# Patient Record
Sex: Female | Born: 1965
Health system: Southern US, Community
[De-identification: ages and names within clinical notes are randomized; demographics above are authoritative.]

## PROBLEM LIST (undated history)

## (undated) DIAGNOSIS — R569 Unspecified convulsions: Secondary | ICD-10-CM

## (undated) DIAGNOSIS — F32A Depression, unspecified: Secondary | ICD-10-CM

## (undated) DIAGNOSIS — F419 Anxiety disorder, unspecified: Secondary | ICD-10-CM

## (undated) DIAGNOSIS — I219 Acute myocardial infarction, unspecified: Secondary | ICD-10-CM

## (undated) DIAGNOSIS — E039 Hypothyroidism, unspecified: Secondary | ICD-10-CM

## (undated) DIAGNOSIS — E785 Hyperlipidemia, unspecified: Secondary | ICD-10-CM

## (undated) DIAGNOSIS — F4310 Post-traumatic stress disorder, unspecified: Secondary | ICD-10-CM

## (undated) DIAGNOSIS — Z8616 Personal history of COVID-19: Secondary | ICD-10-CM

## (undated) DIAGNOSIS — N809 Endometriosis, unspecified: Secondary | ICD-10-CM

## (undated) DIAGNOSIS — F1021 Alcohol dependence, in remission: Secondary | ICD-10-CM

## (undated) DIAGNOSIS — F329 Major depressive disorder, single episode, unspecified: Secondary | ICD-10-CM

## (undated) DIAGNOSIS — G4733 Obstructive sleep apnea (adult) (pediatric): Secondary | ICD-10-CM

## (undated) HISTORY — DX: Hyperlipidemia, unspecified: E78.5

## (undated) HISTORY — DX: Personal history of COVID-19: Z86.16

## (undated) HISTORY — DX: Major depressive disorder, single episode, unspecified: F32.9

## (undated) HISTORY — DX: Depression, unspecified: F32.A

## (undated) HISTORY — DX: Obstructive sleep apnea (adult) (pediatric): G47.33

## (undated) HISTORY — DX: Alcohol dependence, in remission: F10.21

## (undated) HISTORY — DX: Post-traumatic stress disorder, unspecified: F43.10

## (undated) HISTORY — PX: NASAL SINUS SURGERY: SHX719

## (undated) HISTORY — DX: Anxiety disorder, unspecified: F41.9

## (undated) HISTORY — DX: Hypothyroidism, unspecified: E03.9

## (undated) HISTORY — PX: ABDOMINAL SURGERY: SHX537

---

## 2005-09-14 ENCOUNTER — Emergency Department: Payer: Self-pay | Admitting: Emergency Medicine

## 2005-12-16 ENCOUNTER — Emergency Department: Payer: Self-pay

## 2006-09-19 ENCOUNTER — Ambulatory Visit (HOSPITAL_COMMUNITY): Admission: RE | Admit: 2006-09-19 | Discharge: 2006-09-19 | Payer: Self-pay | Admitting: Otolaryngology

## 2006-10-16 ENCOUNTER — Ambulatory Visit (HOSPITAL_BASED_OUTPATIENT_CLINIC_OR_DEPARTMENT_OTHER): Admission: RE | Admit: 2006-10-16 | Discharge: 2006-10-16 | Payer: Self-pay | Admitting: Otolaryngology

## 2006-10-23 ENCOUNTER — Ambulatory Visit (HOSPITAL_COMMUNITY): Admission: RE | Admit: 2006-10-23 | Discharge: 2006-10-23 | Payer: Self-pay | Admitting: Otolaryngology

## 2007-04-24 ENCOUNTER — Emergency Department (HOSPITAL_COMMUNITY): Admission: EM | Admit: 2007-04-24 | Discharge: 2007-04-24 | Payer: Self-pay | Admitting: Emergency Medicine

## 2007-07-09 ENCOUNTER — Ambulatory Visit (HOSPITAL_COMMUNITY): Admission: RE | Admit: 2007-07-09 | Discharge: 2007-07-09 | Payer: Self-pay | Admitting: Interventional Cardiology

## 2007-09-14 LAB — CONVERTED CEMR LAB: Pap Smear: NORMAL

## 2007-10-07 ENCOUNTER — Encounter: Payer: Self-pay | Admitting: Family Medicine

## 2007-12-10 ENCOUNTER — Ambulatory Visit: Payer: Self-pay | Admitting: *Deleted

## 2007-12-10 DIAGNOSIS — F411 Generalized anxiety disorder: Secondary | ICD-10-CM | POA: Insufficient documentation

## 2007-12-10 DIAGNOSIS — F1020 Alcohol dependence, uncomplicated: Secondary | ICD-10-CM | POA: Insufficient documentation

## 2007-12-10 DIAGNOSIS — E785 Hyperlipidemia, unspecified: Secondary | ICD-10-CM | POA: Insufficient documentation

## 2007-12-10 DIAGNOSIS — F319 Bipolar disorder, unspecified: Secondary | ICD-10-CM

## 2007-12-10 DIAGNOSIS — F329 Major depressive disorder, single episode, unspecified: Secondary | ICD-10-CM

## 2008-01-02 ENCOUNTER — Ambulatory Visit: Payer: Self-pay | Admitting: *Deleted

## 2008-01-02 LAB — CONVERTED CEMR LAB
ALT: 21 units/L (ref 0–35)
Alkaline Phosphatase: 62 units/L (ref 39–117)
BUN: 11 mg/dL (ref 6–23)
Basophils Relative: 0.6 % (ref 0.0–3.0)
Cholesterol: 208 mg/dL (ref 0–200)
Creatinine, Ser: 0.9 mg/dL (ref 0.4–1.2)
Eosinophils Relative: 4.8 % (ref 0.0–5.0)
GFR calc non Af Amer: 74 mL/min
Glucose, Bld: 68 mg/dL — ABNORMAL LOW (ref 70–99)
Lymphocytes Relative: 22.2 % (ref 12.0–46.0)
Monocytes Absolute: 0.7 10*3/uL (ref 0.1–1.0)
Monocytes Relative: 8.3 % (ref 3.0–12.0)
Neutro Abs: 5.2 10*3/uL (ref 1.4–7.7)
RBC: 4.16 M/uL (ref 3.87–5.11)
Sodium: 139 meq/L (ref 135–145)
TSH: 1.09 microintl units/mL (ref 0.35–5.50)
Total Bilirubin: 0.6 mg/dL (ref 0.3–1.2)
Total CHOL/HDL Ratio: 4
Total Protein: 7.2 g/dL (ref 6.0–8.3)
Triglycerides: 81 mg/dL (ref 0–149)
VLDL: 16 mg/dL (ref 0–40)

## 2008-01-13 ENCOUNTER — Emergency Department (HOSPITAL_COMMUNITY): Admission: EM | Admit: 2008-01-13 | Discharge: 2008-01-13 | Payer: Self-pay | Admitting: Emergency Medicine

## 2008-03-10 ENCOUNTER — Ambulatory Visit: Payer: Self-pay | Admitting: *Deleted

## 2008-03-10 ENCOUNTER — Ambulatory Visit (HOSPITAL_BASED_OUTPATIENT_CLINIC_OR_DEPARTMENT_OTHER): Admission: RE | Admit: 2008-03-10 | Discharge: 2008-03-10 | Payer: Self-pay | Admitting: *Deleted

## 2008-03-10 ENCOUNTER — Ambulatory Visit: Payer: Self-pay | Admitting: Diagnostic Radiology

## 2008-10-03 ENCOUNTER — Inpatient Hospital Stay (HOSPITAL_COMMUNITY): Admission: EM | Admit: 2008-10-03 | Discharge: 2008-10-06 | Payer: Self-pay | Admitting: Emergency Medicine

## 2008-10-03 LAB — CONVERTED CEMR LAB
CO2: 29 meq/L
Chloride: 102 meq/L
Creatinine, Ser: 0.94 mg/dL
Glucose, Bld: 107 mg/dL
Potassium: 3.7 meq/L
TSH: 2.837 microintl units/mL

## 2008-10-23 ENCOUNTER — Ambulatory Visit: Payer: Self-pay | Admitting: Family Medicine

## 2008-10-23 ENCOUNTER — Encounter: Payer: Self-pay | Admitting: Family Medicine

## 2008-10-23 DIAGNOSIS — E039 Hypothyroidism, unspecified: Secondary | ICD-10-CM | POA: Insufficient documentation

## 2008-10-27 ENCOUNTER — Telehealth: Payer: Self-pay | Admitting: Family Medicine

## 2008-12-01 ENCOUNTER — Telehealth: Payer: Self-pay | Admitting: Family Medicine

## 2009-02-03 ENCOUNTER — Telehealth: Payer: Self-pay | Admitting: Family Medicine

## 2009-02-05 ENCOUNTER — Encounter: Payer: Self-pay | Admitting: Family Medicine

## 2009-02-07 ENCOUNTER — Emergency Department (HOSPITAL_COMMUNITY): Admission: EM | Admit: 2009-02-07 | Discharge: 2009-02-07 | Payer: Self-pay | Admitting: Emergency Medicine

## 2009-02-10 ENCOUNTER — Encounter: Payer: Self-pay | Admitting: Family Medicine

## 2009-02-10 ENCOUNTER — Ambulatory Visit: Payer: Self-pay | Admitting: Family Medicine

## 2009-02-12 ENCOUNTER — Telehealth: Payer: Self-pay | Admitting: *Deleted

## 2009-02-18 ENCOUNTER — Ambulatory Visit: Payer: Self-pay | Admitting: Family Medicine

## 2009-02-18 ENCOUNTER — Encounter: Payer: Self-pay | Admitting: Family Medicine

## 2009-02-25 ENCOUNTER — Telehealth: Payer: Self-pay | Admitting: Family Medicine

## 2009-03-02 ENCOUNTER — Telehealth (INDEPENDENT_AMBULATORY_CARE_PROVIDER_SITE_OTHER): Payer: Self-pay | Admitting: Family Medicine

## 2009-03-03 ENCOUNTER — Ambulatory Visit: Payer: Self-pay | Admitting: Family Medicine

## 2009-03-04 ENCOUNTER — Telehealth: Payer: Self-pay | Admitting: *Deleted

## 2009-03-05 ENCOUNTER — Ambulatory Visit (HOSPITAL_COMMUNITY): Admission: RE | Admit: 2009-03-05 | Discharge: 2009-03-05 | Payer: Self-pay | Admitting: Family Medicine

## 2009-03-24 ENCOUNTER — Encounter: Payer: Self-pay | Admitting: Family Medicine

## 2009-04-28 ENCOUNTER — Ambulatory Visit (HOSPITAL_BASED_OUTPATIENT_CLINIC_OR_DEPARTMENT_OTHER): Admission: RE | Admit: 2009-04-28 | Discharge: 2009-04-29 | Payer: Self-pay | Admitting: Orthopedic Surgery

## 2009-05-13 ENCOUNTER — Encounter: Admission: RE | Admit: 2009-05-13 | Discharge: 2009-08-04 | Payer: Self-pay | Admitting: Orthopedic Surgery

## 2009-05-14 ENCOUNTER — Encounter: Payer: Self-pay | Admitting: Family Medicine
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
## 2009-05-25 ENCOUNTER — Telehealth: Payer: Self-pay | Admitting: Family Medicine

## 2009-05-27 ENCOUNTER — Telehealth: Payer: Self-pay | Admitting: Family Medicine

## 2009-05-28 ENCOUNTER — Telehealth: Payer: Self-pay | Admitting: Family Medicine

## 2009-06-02 ENCOUNTER — Telehealth: Payer: Self-pay | Admitting: Family Medicine

## 2009-06-20 IMAGING — CR DG CHEST 2V
2 series · 2 of 2 positions shown · non-contrast
Comparison: None.

CLINICAL DATA: Cough and fever. Chest pain.

[view not recorded (1 of 2)]
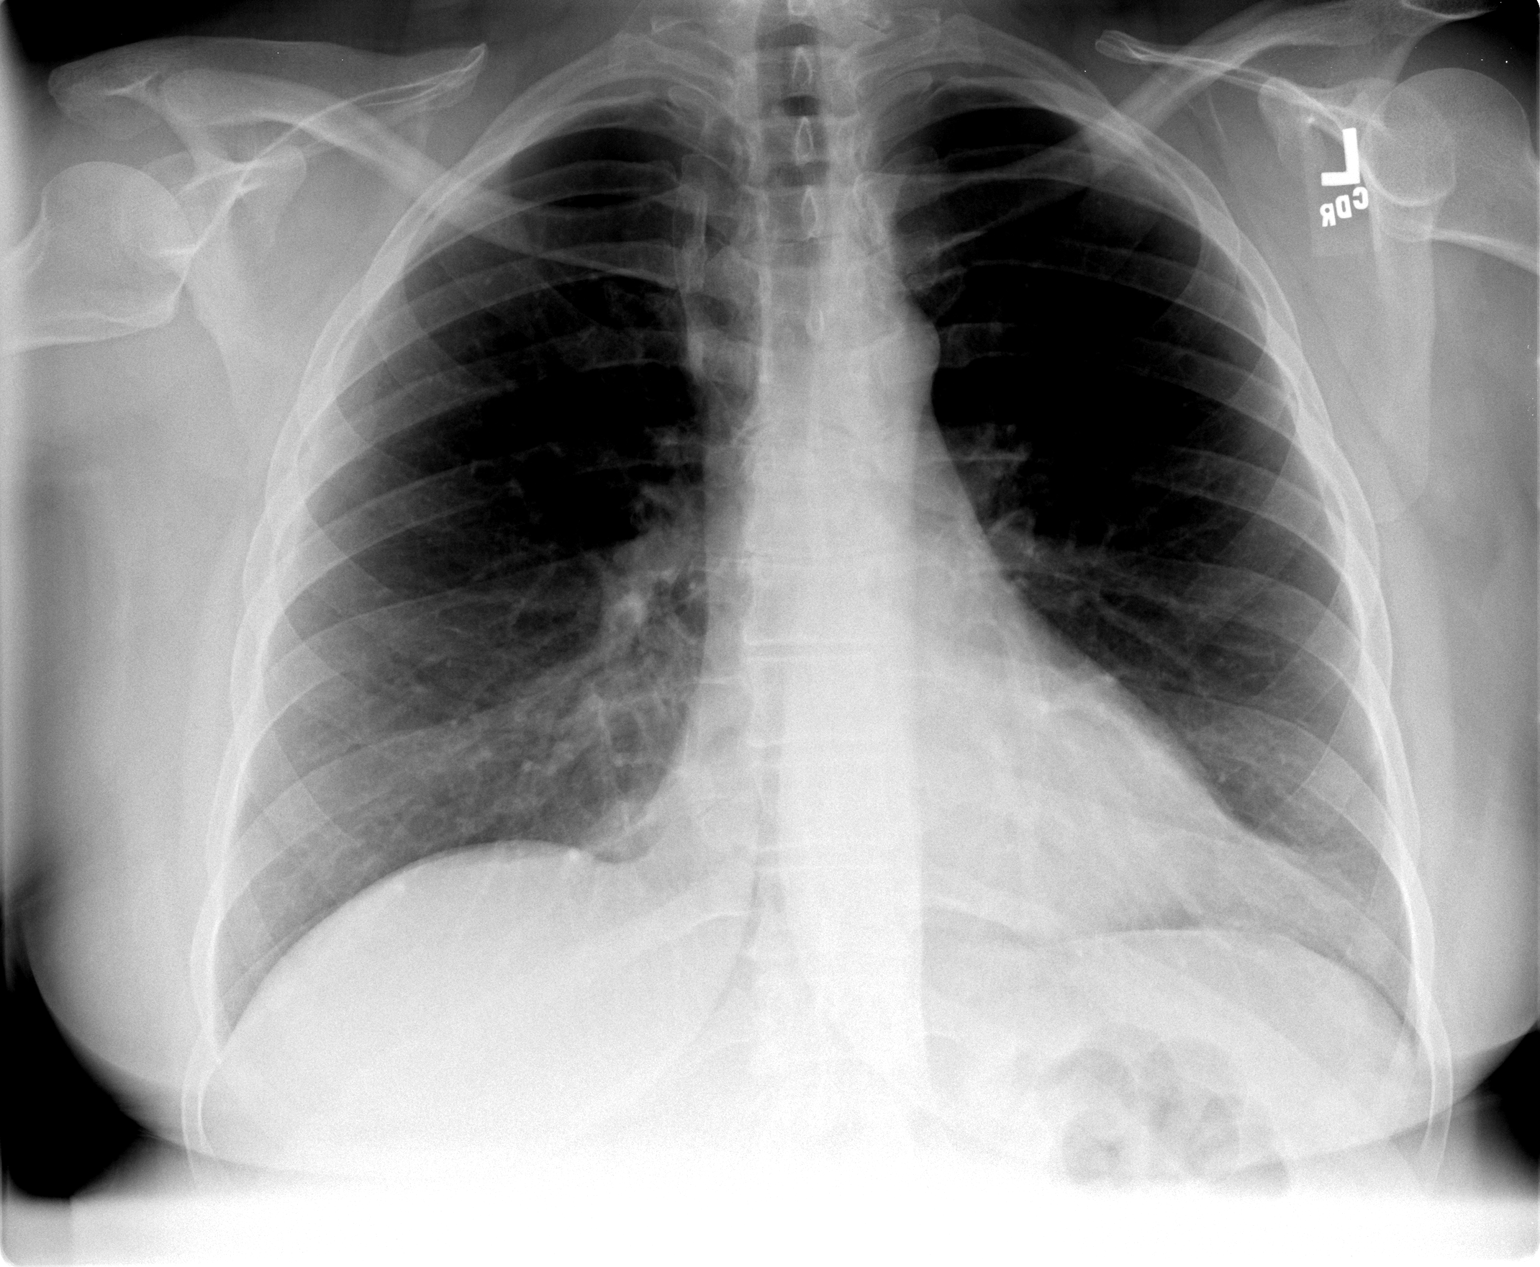

[view not recorded (2 of 2)]
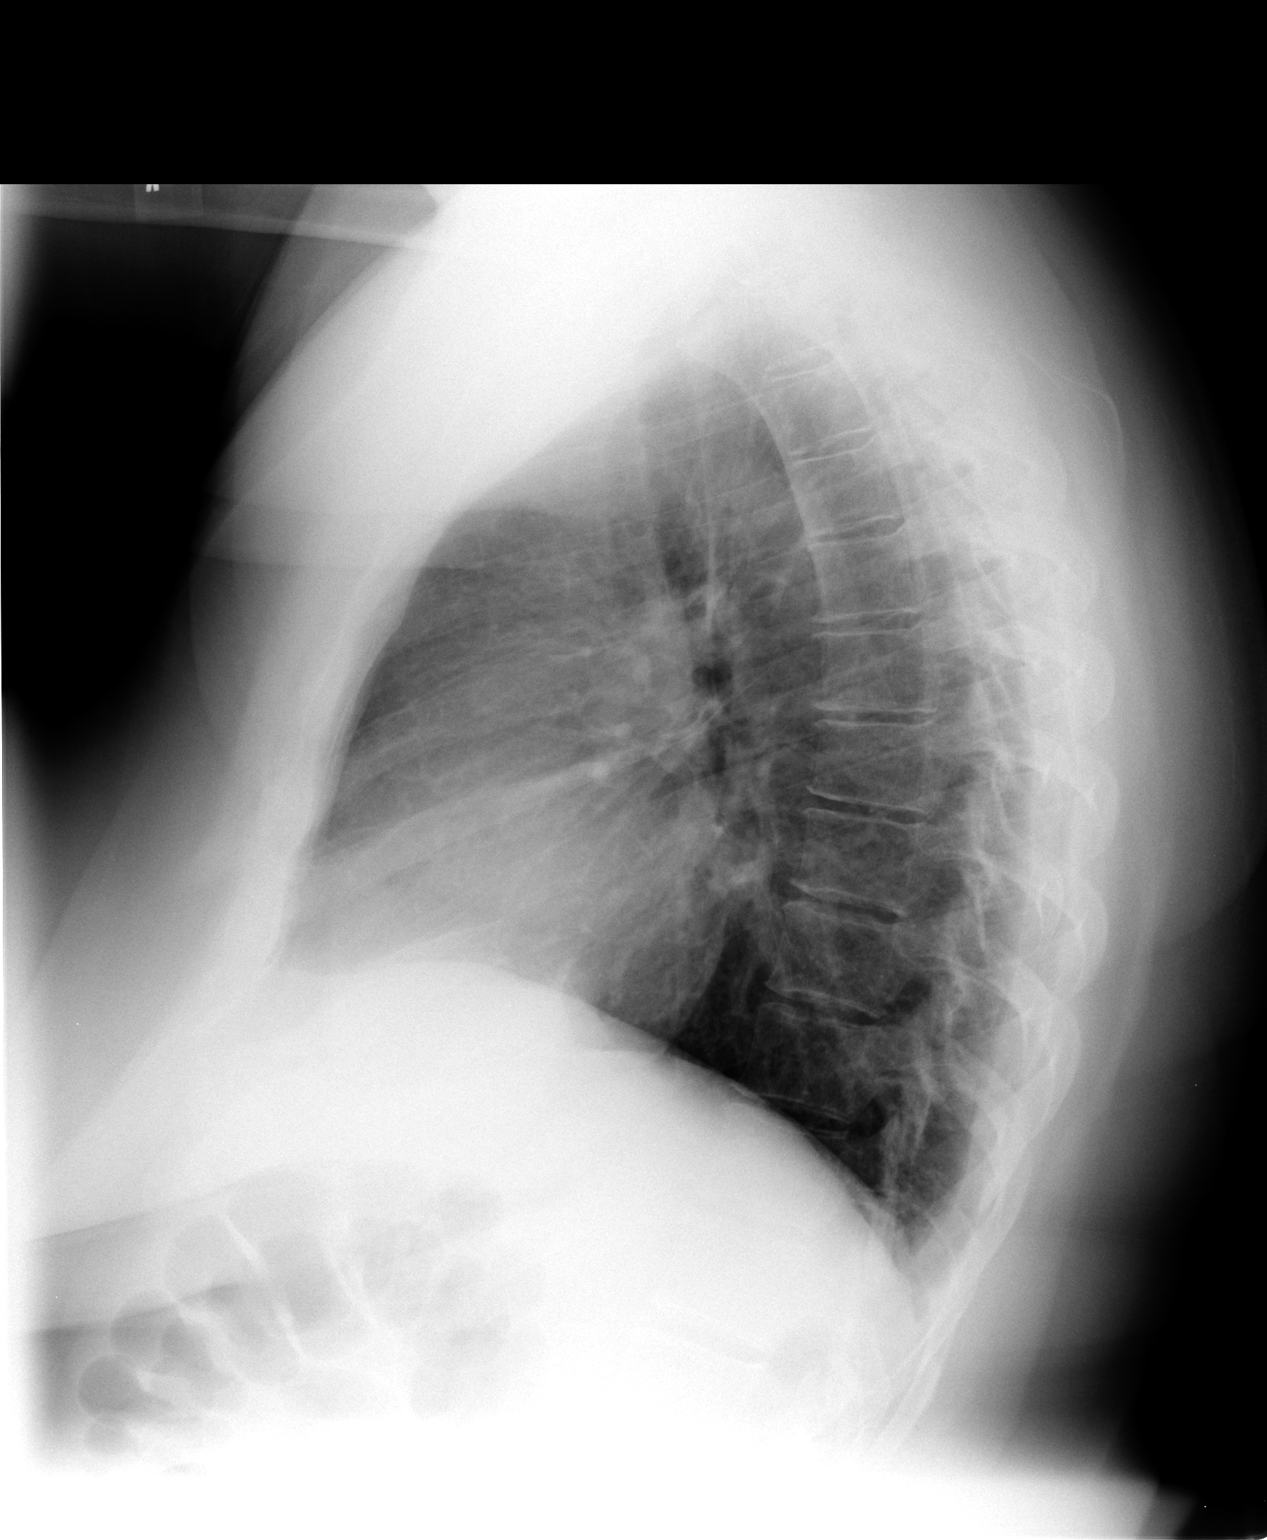

[2 of 2 positions shown; findings below may reference images not displayed]

CHEST - 2 VIEW:

No focal consolidation or pulmonary edema. No evidence of pleural effusion.
Opacity at the cardiac apex is probably related to a fat pad. The
cardiopericardial silhouette is within normal limits for size. Bony structures
of the image thorax are intact.
IMPRESSION: No evidence for pneumonia or pulmonary edema.

Opacity at the cardiac apex is probably a fat pad. Followup chest x-ray or CT
scan without contrast is recommended to ensure stability.

## 2009-06-25 ENCOUNTER — Ambulatory Visit: Payer: Self-pay | Admitting: Family Medicine

## 2009-06-25 ENCOUNTER — Encounter: Payer: Self-pay | Admitting: Family Medicine

## 2009-06-26 LAB — CONVERTED CEMR LAB
ALT: 11 units/L (ref 0–35)
Alkaline Phosphatase: 55 units/L (ref 39–117)
BUN: 13 mg/dL (ref 6–23)
Calcium: 9 mg/dL (ref 8.4–10.5)
Chloride: 102 meq/L (ref 96–112)
Creatinine, Ser: 0.96 mg/dL (ref 0.40–1.20)
HCT: 39.7 % (ref 36.0–46.0)
Hemoglobin: 12.8 g/dL (ref 12.0–15.0)
LDL Cholesterol: 87 mg/dL (ref 0–99)
MCV: 98 fL (ref 78.0–100.0)
RDW: 13.4 % (ref 11.5–15.5)
TSH: 6.51 microintl units/mL — ABNORMAL HIGH (ref 0.350–4.500)
Total Bilirubin: 0.3 mg/dL (ref 0.3–1.2)
Total Protein: 6.7 g/dL (ref 6.0–8.3)
Triglycerides: 198 mg/dL — ABNORMAL HIGH (ref <150)

## 2009-06-29 ENCOUNTER — Telehealth: Payer: Self-pay | Admitting: Family Medicine

## 2009-09-08 ENCOUNTER — Emergency Department (HOSPITAL_COMMUNITY): Admission: EM | Admit: 2009-09-08 | Discharge: 2009-09-08 | Payer: Self-pay | Admitting: Emergency Medicine

## 2009-09-08 ENCOUNTER — Encounter: Payer: Self-pay | Admitting: Family Medicine

## 2009-09-09 ENCOUNTER — Encounter: Payer: Self-pay | Admitting: Family Medicine

## 2009-09-09 ENCOUNTER — Ambulatory Visit: Payer: Self-pay | Admitting: Family Medicine

## 2009-09-09 DIAGNOSIS — G4733 Obstructive sleep apnea (adult) (pediatric): Secondary | ICD-10-CM

## 2009-09-10 ENCOUNTER — Encounter: Payer: Self-pay | Admitting: *Deleted

## 2009-09-22 ENCOUNTER — Encounter: Payer: Self-pay | Admitting: Family Medicine

## 2009-11-04 ENCOUNTER — Telehealth: Payer: Self-pay | Admitting: Family Medicine

## 2009-11-05 ENCOUNTER — Telehealth: Payer: Self-pay | Admitting: *Deleted

## 2009-11-08 ENCOUNTER — Encounter: Payer: Self-pay | Admitting: Family Medicine

## 2009-11-10 ENCOUNTER — Telehealth: Payer: Self-pay | Admitting: *Deleted

## 2009-11-26 ENCOUNTER — Encounter: Payer: Self-pay | Admitting: Family Medicine

## 2009-12-07 ENCOUNTER — Ambulatory Visit (HOSPITAL_COMMUNITY)
Admission: RE | Admit: 2009-12-07 | Discharge: 2009-12-07 | Payer: Self-pay | Source: Home / Self Care | Admitting: Interventional Cardiology

## 2009-12-16 ENCOUNTER — Encounter (INDEPENDENT_AMBULATORY_CARE_PROVIDER_SITE_OTHER): Payer: Self-pay | Admitting: *Deleted

## 2009-12-25 ENCOUNTER — Ambulatory Visit: Payer: Self-pay

## 2009-12-29 ENCOUNTER — Ambulatory Visit: Payer: Self-pay | Admitting: Family Medicine

## 2009-12-30 ENCOUNTER — Encounter: Payer: Self-pay | Admitting: Family Medicine

## 2009-12-30 ENCOUNTER — Ambulatory Visit: Payer: Self-pay | Admitting: Family Medicine

## 2009-12-30 LAB — CONVERTED CEMR LAB
TSH: 3.523 microintl units/mL (ref 0.350–4.500)
Valproic Acid Lvl: 76.3 ug/mL (ref 50.0–100.0)

## 2009-12-31 ENCOUNTER — Ambulatory Visit: Payer: Self-pay

## 2010-01-22 ENCOUNTER — Encounter: Payer: Self-pay | Admitting: *Deleted

## 2010-02-07 ENCOUNTER — Encounter: Payer: Self-pay | Admitting: *Deleted

## 2010-02-16 NOTE — Progress Notes (Signed)
----   Converted from flag ---- ---- 11/08/2009 2:05 PM, Cat Ta MD wrote: Pt saw [facility] on 09/22/09, exercise treadmill was ordered.  Did she get it done?  can we have result of it? thanks. ------------------------------  called and requested records.

## 2010-02-16 NOTE — Progress Notes (Signed)
Summary: Psych referral  Phone Note Outgoing Call   Call placed by: Clayton Jarmon MD,  May 27, 2009 7:00 PM Call placed to: Patient Summary of Call: Tried to call again re psych referral.  Have not heard back from pt.  See prev note Initial call taken by: Claudell Wohler MD,  May 27, 2009 7:01 PM

## 2010-02-16 NOTE — Consult Note (Signed)
Summary: [facility] Cards   Imported By: De Nurse 11/12/2009 16:01:02  _____________________________________________________________________  External Attachment:    Type:   Image     Comment:   External Document

## 2010-02-16 NOTE — Miscellaneous (Signed)
Summary: refill for lamictal  Clinical Lists Changes  Medications: Changed medication from LAMICTAL 200 MG TABS (LAMOTRIGINE) 1 tab by mouth daily to LAMICTAL 100 MG TABS (LAMOTRIGINE) 1 tab by mouth daily - Signed Rx of LAMICTAL 100 MG TABS (LAMOTRIGINE) 1 tab by mouth daily;  #90 x 3;  Signed;  Entered by: Angeline Slim MD;  Authorized by: Angeline Slim MD;  Method used: Electronically to [facility]*, [address] Shipping/mailing, [place], [place]  [address], Ph: [phone], Fax: [phone]    Prescriptions: LAMICTAL 100 MG TABS (LAMOTRIGINE) 1 tab by mouth daily  #90 x 3   Entered and Authorized by:   Angeline Slim MD   Signed by:   Angeline Slim MD on 03/24/2009   Method used:   Electronically to        [facility]* (retail)       [address]       [address] Shipping/mailing       [place], [place]  [address]       Ph: [phone]       Fax: [phone]   RxID:   [id number]  Left message for pt.  I have refilled lamictal (Lamotrigine), but at a decreased dose.  I want her to take 100mg  daily vs 200mg .  Romie Tay MD  March 24, 2009 5:00 PM

## 2010-02-16 NOTE — Assessment & Plan Note (Signed)
Summary: ED f/u CP/scSEE NOTES   Vital Signs:  Patient profile:   45 year old female Height:      66.5 inches Weight:      214 pounds BMI:     34.15 Temp:     98.5 degrees F oral Pulse rate:   81 / minute BP sitting:   100 / 68  (right arm) Cuff size:   regular  Vitals Entered By: Tessie Fass CMA (September 09, 2009 1:52 PM) CC: ER F/U for CP Is Patient Diabetic? No Pain Assessment Patient in pain? yes     Location: head Intensity: 6   Primary Care Provider:  Cat Ta MD  CC:  ER F/U for CP.  History of Present Illness: 45 y/o F with history of coronary spasm, hypothyroidism, OSA on Cpap, bipolar disorder was seen in ER yesterday for chest pain.  Yesterday chest pain was 10 out 10.  POC CE was negative, EKG with no changes, CXR negative, labs were negative wnl.  She was discharged home from ED.  Pt is very concerned because of her past history of MI 60.  At that time she only felt back pain.   She also had headache yesterday that preceded the NTG and worsened after she was given NTG x 3.    Now she is having CP that is substernal, feeling like burning sensation and tightness.  Pain is 5-6 out of 10.  No pain in arms, jaw or neck.  Some back pain.  Chest pain not changed with movements.  No diarphoresis now.  No nausea now. No dypsnea now.  Also having HA with chest pain.  +palpitations x 1 week.  She can feel her heart racing, then it would stop, then it would race again.  No syncope.  palpitations makes her feel tired.     Psychiatrist: Dr Nolen Mu, new pt appt next Mon  Neurologist: Dr Terrace Arabia, does not have appt yet    Habits & Providers  Alcohol-Tobacco-Diet     Tobacco Status: never  Current Medications (verified): 1)  Lamictal 100 Mg Tabs (Lamotrigine) .Marland Kitchen.. 1 Tab By Mouth Daily 2)  Divalproex Sodium 500 Mg Tbec (Divalproex Sodium) .... Take 2 Tablets By Mouth At Bedtime 3)  Wellbutrin Xl 300 Mg Xr24h-Tab (Bupropion Hcl) .... Take 1 Tablet By Mouth Once A Day 4)   Synthroid 50 Mcg Tabs (Levothyroxine Sodium) .Marland Kitchen.. 1 Tab By Mouth Daily (New Dose) 5)  Simvastatin 40 Mg Tabs (Simvastatin) .Marland Kitchen.. 1 Tab By Mouth At Bedtime (New Dose) 6)  Inderal La 60 Mg Xr24h-Cap (Propranolol Hcl) .... Per Neuro 7)  Ibuprofen 600 Mg Tabs (Ibuprofen) .Marland Kitchen.. 1 Tab By Mouth Four Times Daily For Headache 8)  Nexium 40 Mg Cpdr (Esomeprazole Magnesium) .Marland Kitchen.. 1 Tab By Mouth Daily For Reflux  Allergies (verified): 1)  ! Codeine  Past History:  Past Surgical History: Last updated: 10/23/2008 out patient sinus surgery heart cath. 09/08 - clean treadmill stress test 2009: normal  Family History: Last updated: 06/25/2009 Family History of Alcoholism/Addiction (mom and dad) Family History of Arthritis Family History of  "female cancer" (ovarian) Family History Lung cancer (maternal uncle was smoker) Family History of  prostate cancer (paternal uncle) Family History High cholesterol (mom) Family History Hypertension (mom) Family History of  stroke (mom) Family History of  CAD / MI (mom, aunt, mat grandmother) Family History of  emotional/mental illness Family History of  diabetes  Social History: Last updated: 06/25/2009 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  Lives  with rooomate, Nadine Counts. No children No tobacco, No drugs, Occasional alcohol Lesbian   Risk Factors: Alcohol Use: 2 (10/23/2008) >5 drinks/d w/in last 3 months: no (10/23/2008) Caffeine Use: 2-3 - told to cut back (01/02/2008) Exercise: no (12/10/2007)  Risk Factors: Smoking Status: never (09/09/2009) Passive Smoke Exposure: yes (12/10/2007)  Past Medical History: -[facility]: seizure activity thougth to be from Lamictal withdrawal.  Seen at [facility] by Dr Terrace Arabia of [facility] [phone] CT head 10/03/08: no acute intracranial activity EEG 09/2008: normal awake EEG -Hypothyroidism -Hyperlipidemia -Past h/o alcoholism - last binge 2001 - still drinks occasionally, but "not to excess" -Heart murmur -  since childhood -UTI's -Heart attack 09/10/95; 09/2006 cath clean per patient - thought to be from coronary spasm.  Followd by [facility] [employer]  [facility], Dr Katrinka Blazing Treadmill stress test 2009: normal per pt -Obstructive Sleep Apnea, on CPAP -Bi-polar (sees Dr Tomasa Rand, monthly appt) -Anxiety -Depression -50% deaf in both ears  -Personal history of abnormal pap  Review of Systems General:  Complains of fatigue; denies chills and fever. CV:  Complains of chest pain or discomfort and palpitations; denies bluish discoloration of lips or nails, difficulty breathing at night, fainting, lightheadness, near fainting, and shortness of breath with exertion. Resp:  Complains of chest discomfort; denies chest pain with inspiration, cough, coughing up blood, shortness of breath, and wheezing. GI:  Denies abdominal pain and change in bowel habits.  Physical Exam  General:  Well-developed,well-nourished,in no acute distress; alert,appropriate and cooperative throughout examination. vitals reviewed.   Head:  normocephalic and atraumatic.   Neck:  supple, full ROM, and no masses.   No bruit Lungs:  Normal respiratory effort, chest expands symmetrically. Lungs are clear to auscultation, no crackles or wheezes. Heart:  Normal rate and regular rhythm. S1 and S2 normal without gallop, murmur, click, rub or other extra sounds.no JVD.   Abdomen:  Bowel sounds positive,abdomen soft and non-tender without masses, organomegaly or hernias noted. Pulses:  R radial normal, R dorsalis pedis normal, R carotid normal, L radial normal, L dorsalis pedis normal, and L carotid normal.   Extremities:  No clubbing, cyanosis, edema, or deformity noted with normal full range of motion of all joints.   Neurologic:  alert & oriented X3 and cranial nerves II-XII intact.   Skin:  Intact without suspicious lesions or rashes   Impression & Recommendations:  Problem # 1:  CHEST PAIN (ICD-786.50) Assessment New Chest pain does not seem  cardiac in nature and is reproducible in sternal area.  Athough pt indicates a history of MI in 1997,  Pt had catherization in 2008 that showed coronary spasm.  She also had stress test in 2009 that was normal.  I would like to refer her back to Dr Katrinka Blazing, [facility], for further workup. I am concerned with the feelings of palpitations that she had.  ED note indicated and EKG with NSR, 76 bpm, no ST changes.  I will recheck TSH today.     Orders: [facility]- Est Level  3 ([address]) Cardiology Referral (Cardiology)  Problem # 2:  SLEEP APNEA, OBSTRUCTIVE (ICD-327.23) Assessment: Comment Only On CPAP.  Has new CPAP machine.    Problem # 3:  HYPOTHYROIDISM (ICD-244.9) Need TSH.  Last TSH  06/2009 elevated at 6.510.  Her updated medication list for this problem includes:    Synthroid 50 Mcg Tabs (Levothyroxine sodium) .Marland Kitchen... 1 tab by mouth daily (new dose)  Problem # 4:  HYPERLIPIDEMIA (ICD-272.4) Lipids at goal.  LDL 87, HDL 46.  Her updated medication list for this problem includes:    Simvastatin 40 Mg Tabs (Simvastatin) .Marland Kitchen... 1 tab by mouth at bedtime  Problem # 5:  BIPOLAR AFFECTIVE DISORDER (ICD-296.80) Assessment: Unchanged Has not seen new psychiatrist yet.  Appt with Dr Nolen Mu next Monday.   Taking Lamictal 100mg  at bedtime, Divalproic 1000mg  at bedtime   Problem # 6:  DEPRESSION (ICD-311) Assessment: Unchanged  Her updated medication list for this problem includes:    Wellbutrin Xl 300 Mg Xr24h-tab (Bupropion hcl) .Marland Kitchen... Take 1 tablet by mouth once a day  Problem # 7:  ANXIETY (ICD-300.00) Assessment: Unchanged  Her updated medication list for this problem includes:    Wellbutrin Xl 300 Mg Xr24h-tab (Bupropion hcl) .Marland Kitchen... Take 1 tablet by mouth once a day  Complete Medication List: 1)  Lamictal 100 Mg Tabs (Lamotrigine) .Marland Kitchen.. 1 tab by mouth daily 2)  Divalproex Sodium 500 Mg Tbec (Divalproex sodium) .... Take 2 tablets by mouth at bedtime 3)  Wellbutrin Xl 300 Mg Xr24h-tab  (Bupropion hcl) .... Take 1 tablet by mouth once a day 4)  Synthroid 50 Mcg Tabs (Levothyroxine sodium) .Marland Kitchen.. 1 tab by mouth daily (new dose) 5)  Simvastatin 40 Mg Tabs (Simvastatin) .Marland Kitchen.. 1 tab by mouth at bedtime (new dose) 6)  Inderal La 60 Mg Xr24h-cap (Propranolol hcl) .... Per neuro 7)  Ibuprofen 600 Mg Tabs (Ibuprofen) .Marland Kitchen.. 1 tab by mouth four times daily for headache 8)  Nexium 40 Mg Cpdr (Esomeprazole magnesium) .Marland Kitchen.. 1 tab by mouth daily for reflux  Other Orders: Mammogram (Screening) (Mammo)  Patient Instructions: 1)  Please schedule a follow-up appointment in 2 weeks for chest discomfort. 2)  We will set up an appointment with your cardiologist, Dr Katrinka Blazing.  We will call you with appt.   Prescriptions: NEXIUM 40 MG CPDR (ESOMEPRAZOLE MAGNESIUM) 1 tab by mouth daily for reflux  #30 x 3   Entered and Authorized by:   Angeline Slim MD   Signed by:   Angeline Slim MD on 09/09/2009   Method used:   Electronically to        [facility]* (retail)       [address]       [address] Shipping/mailing       [place], [place]  [address]       Ph: [phone]       Fax: [phone]   RxID:   [id number] IBUPROFEN 600 MG TABS (IBUPROFEN) 1 tab by mouth four times daily for headache  #120 x 0   Entered and Authorized by:   Angeline Slim MD   Signed by:   Angeline Slim MD on 09/09/2009   Method used:   Electronically to        [facility]* (retail)       [address]       [address] Shipping/mailing       [place], [place]  [address]       Ph: [phone]       Fax: [phone]   RxID:   [id number]    Prevention & Chronic Care Immunizations   Influenza vaccine: Not documented    Tetanus booster: 01/02/2008: Tdap    Pneumococcal vaccine: Not documented  Other Screening   Pap smear: NEGATIVE FOR INTRAEPITHELIAL LESIONS OR MALIGNANCY.  (06/25/2009)   Pap smear action/deferral: Deferred-2 yr interval  (09/09/2009)    Mammogram: Normal Bilateral   (05/13/1999)   Mammogram action/deferral: Ordered  (  09/09/2009)   Smoking status: never  (09/09/2009)  Lipids   Total Cholesterol: 173  (06/25/2009)   LDL: 87  (06/25/2009)   LDL Direct: 188  (10/23/2008)   HDL: 46  (06/25/2009)   Triglycerides: 198  (06/25/2009)    SGOT (AST): 12  (06/25/2009)   SGPT (ALT): 11  (06/25/2009)   Alkaline phosphatase: 55  (06/25/2009)   Total bilirubin: 0.3  (06/25/2009)    Lipid flowsheet reviewed?: Yes   Progress toward LDL goal: At goal  Self-Management Support :   Personal Goals (by the next clinic visit) :      Personal LDL goal: 100  (09/09/2009)    Lipid self-management support: Not documented    Nursing Instructions: Schedule screening mammogram (see order)

## 2010-02-16 NOTE — Progress Notes (Signed)
Summary: phn msg  Phone Note Call from Patient Call back at Home Phone [phone]   Caller: Patient Summary of Call: needs to talk to Krista Young about shot - thinks she has a pinched nerve- still bothering her  pls call her Initial call taken by: De Nurse,  February 25, 2009 9:27 AM  Follow-up for Phone Call        Steroid injection on 2/2.  Pain not as bad as it was but does not feel that it has not gotten better.  She would like to know if she can get another injection.  Told pt that we cannot give injection that close together and if injection did not help then there is no reason to get another one.  I asked pt to call me next week.  If no improvement at that time then we will order MRI.  Pt agreed.   Follow-up by: Renelda Kilian MD,  February 25, 2009 11:59 AM    Tried calling pt back but got voicemail.  Left message for pt I was calling her back.  Fernande Treiber MD  February 25, 2009 11:05 AM

## 2010-02-16 NOTE — Progress Notes (Signed)
Summary: phn msg  Phone Note Call from Patient Call back at [facility] Phone [phone]   Caller: Patient Summary of Call: has a question about what was discussed. Initial call taken by: De Nurse,  May 28, 2009 11:19 AM  Follow-up for Phone Call        Had surgery 4/12 for left shoulder.  Been off work since.   Pt received message from me regarding Psych referral.  She has not called for appt yet. States she will call. Pt has appt with me on 6/9 at 9am.  She will come in at 8:30 for labs. Follow-up by: Sharon Stapel MD,  May 29, 2009 5:14 PM    Tried to call pt back. LM.  What is her question?  Britzy Graul MD  May 29, 2009 10:55 AM

## 2010-02-16 NOTE — Progress Notes (Signed)
----   Converted from flag ---- ---- 11/02/2009 8:17 PM, Cat Ta MD wrote: Krista Young, pt had appt with Dr Katrinka Blazing of [facility] on   09/22/09. Please call them to get ov note. thank you. ------------------------------  called to request records.

## 2010-02-16 NOTE — Letter (Signed)
Summary: FMLA  FMLA   Imported By: Clydell Hakim 03/03/2009 16:52:23  _____________________________________________________________________  External Attachment:    Type:   Image     Comment:   External Document

## 2010-02-16 NOTE — Miscellaneous (Signed)
Summary: walk in  Clinical Lists Changes she went to ED last night for c/o chest pain. she had thought it was indigstion & drank milk. Also had a HA, back pain & n & v. has hx MI in 2009. strong family hx of cardiac problems & death from cardiac reasons per pt.  states ED told her they did not find anything. 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 right now her chest is "tight" VS are 116/80 p 84 O2% 98. discussed with Dr. McDiarmid who reviewed ED records. OK to wait for 1:30 work in. pt agreed with plan. told her if her symptoms came back as bad as they were last night, call 911 & go to ED. do not wait for 1:30 here. she agreed.Golden Circle RN  September 09, 2009 12:05 PM

## 2010-02-16 NOTE — Progress Notes (Signed)
Summary: phnmsg  Phone Note Call from Patient Call back at Home Phone [phone]   Caller: Patient Summary of Call: pt has question about lab work. Initial call taken by: De Nurse,  Jun 02, 2009 4:01 PM    Wanted to know her blood type, but I told her we never checked.

## 2010-02-16 NOTE — Miscellaneous (Signed)
  Clinical Lists Changes  Observations: Added new observation of PAST MED HX: -Admitted [facility] 09/[facility]: seizure activity thougth to be from Lamictal withdrawal.  Seen at [facility] by Dr Terrace Arabia of [facility] [phone] CT head 10/03/08: no acute intracranial activity EEG 09/2008: normal awake EEG -Hypothyroidism -Hyperlipidemia -Past h/o alcoholism - last binge 2001 - still drinks occasionally, but "not to excess" -Heart murmur - since childhood -UTI's -Heart attack 09/10/95; 09/2006 cath clean per patient - thought to be from coronary spasm.  Followd by [facility] Cardiologist, Dr Katrinka Blazing. Saw on 09/22/09: Dr Katrinka Blazing thinks chest tightness atypical for ischemic HD. Exercise stress test ordered.  Treadmill stress test 2009: normal per pt -Obstructive Sleep Apnea, on CPAP -Bi-polar (sees Dr Tomasa Rand, monthly appt) -Anxiety -Depression -50% deaf in both ears  -Personal history of abnormal pap  (11/08/2009 14:05) Added new observation of PRIMARY MD: Cat Ta MD (11/08/2009 14:05)       Past History:  Past Medical History: -Admitted [facility] 09/2008: seizure activity thougth to be from Lamictal withdrawal.  Seen at [facility] by Dr Terrace Arabia of [facility] [phone] CT head 10/03/08: no acute intracranial activity EEG 09/2008: normal awake EEG -Hypothyroidism -Hyperlipidemia -Past h/o alcoholism - last binge 2001 - still drinks occasionally, but "not to excess" -Heart murmur - since childhood -UTI's -Heart attack 09/10/95; 09/2006 cath clean per patient - thought to be from coronary spasm.  Followd by [facility] Cardiologist, Dr Katrinka Blazing. Saw on 09/22/09: Dr Katrinka Blazing thinks chest tightness atypical for ischemic HD. Exercise stress test ordered.  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

## 2010-02-16 NOTE — Miscellaneous (Signed)
Summary: Changing Prob List   Clinical Lists Changes  Problems: Removed problem of ROUTINE GYNECOLOGICAL EXAMINATION (ICD-V72.31) Removed problem of ENCOUNTER FOR LONG-TERM USE OF OTHER MEDICATIONS (ICD-V58.69) Removed problem of WELL ADULT (ICD-V70.0) Removed problem of SCREENING FOR MALIGNANT NEOPLASM OF THE CERVIX (ICD-V76.2) Removed problem of FAMILY HISTORY OF ALCOHOLISM/ADDICTION (ICD-V61.41) Removed problem of SEIZURES, HX OF (ICD-V12.49) Removed problem of MYOCARDIAL INFARCTION, HX OF (ICD-412) Removed problem of CHEST PAIN (ICD-786.50) Observations: Added new observation of PAST MED HX: -[facility]mitted [facility] 09/[facility]: seizure activity thougth to be from Lamictal withdrawal.  Seen at [facility] by Dr Terrace Arabia of [facility] [phone] CT head 10/03/08: no acute intracranial activity EEG 09/2008: normal awake EEG -Hypothyroidism -Hyperlipidemia -Past h/o alcoholism - last binge 2001 - still drinks occasionally, but "not to excess" -Heart murmur - since childhood -UTI's -Heart attack 09/10/95; 09/2006 cath clean per patient - thought to be from coronary spasm.  Followd by [facility] Cardiologist, Dr Katrinka Blazing. Saw on 09/22/09: Dr Katrinka Blazing thinks chest tightness atypical for ischemic HD. Exercise stress test ordered.  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  Seen at [facility] by Dr Terrace Arabia of [facility] [phone] CT head 10/03/08: no acute intracranial activity EEG 09/2008: normal awake EEG -Hypothyroidism -Hyperlipidemia -Past h/o alcoholism - last binge 2001 - still drinks occasionally, but "not to excess" -Heart murmur - since  childhood -UTI's -Heart attack 09/10/95; 09/2006 cath clean per patient - thought to be from coronary spasm.  Followd by [facility] Cardiologist, Dr Katrinka Blazing. Saw on 09/22/09: Dr Katrinka Blazing thinks chest tightness atypical for ischemic HD. Exercise stress test ordered.  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

## 2010-02-16 NOTE — Progress Notes (Signed)
Summary: pain meds  Phone Note Call from Patient Call back at Home Phone [phone]   Caller: Patient Summary of Call: wants to know if she can get something for pain for a tooth that is broken - can't get into dentist until 6/23 [facility] Out pt pharm Initial call taken by: De Nurse,  June 29, 2009 1:47 PM  Follow-up for Phone Call        just rec'd this note. called pt & informed her. states she is already taking tylenol & ibu which only helps a little. urged her to call her dentist's office every am & ask if there have been any cancellations. to pcp Follow-up by: Golden Circle RN,  July 01, 2009 8:57 AM    Pt can take tylenol. I really don't think narcotic is appropriate.  Krista Halvorsen MD  June 29, 2009 2:09 PM

## 2010-02-16 NOTE — Progress Notes (Signed)
----   Converted from flag ---- ---- 03/04/2009 7:19 AM, Cat Ta MD wrote: Krista Young formed scanned in [employer] dated 02/18/2009: please print and call pt to come pick up as it was due 03/03/09.  thank you. ------------------------------  called pt lmom to pick up fmla form at front desk

## 2010-02-16 NOTE — Progress Notes (Signed)
Summary: FYI  Phone Note Call from Patient Call back at Home Phone [phone]   Caller: Patient Summary of Call: went to neurologist today and doctor is sending for sleep study and also put her on Inderal 60mg  Initial call taken by: De Nurse,  February 03, 2009 10:45 AM    New/Updated Medications: INDERAL LA 60 MG XR24H-CAP (PROPRANOLOL HCL) Per Neuro

## 2010-02-16 NOTE — Miscellaneous (Signed)
Summary: call from CDU  Clinical Lists Changes Was called by NP Onalee Hua, about pt, did not want a fromal consult more social in aspect and if can follow up as outpt tomorrow if pt is ruled out for chest pain.  Agreed with plan told provider that we are in house if needed.  Told that clinic opens at 830 am and pt can be seen as workin for fatigue, headache and chest pain.

## 2010-02-16 NOTE — Assessment & Plan Note (Signed)
Summary: f/u,df   Vital Signs:  Patient profile:   45 year old female Height:      66.5 inches Weight:      219.7 pounds BMI:     35.06 Pulse rate:   78 / minute BP sitting:   109 / 69  (left arm)  Vitals Entered By: Arlyss Repress CMA, (June 25, 2009 8:50 AM) CC: pap. blood work. LMP 05-28-09 Is Patient Diabetic? No Pain Assessment Patient in pain? no        Primary Care Provider:  Dusti Tetro MD  CC:  pap. blood work. LMP 05-28-09.  History of Present Illness: 45 y/o F here for pap  LMP 05/28/09.  No vaginal discharge.  Menses regular.  Cramping with menses not intolerable.    Anxiety: She is still on Disability from her Left shoulder surgery.  Her mother is dying, she just recently changed her mother's code status to DNR.  She has moved her mother to her apt complex so that she can care for her.  Feeling overwhelmed.  She has appt with Psychiatrist, Dr Althea Charon, at end of this month. denies si/hi.  has roommate, Nadine Counts, who is a good support for her.      Habits & Providers  Alcohol-Tobacco-Diet     Tobacco Status: never  Current Medications (verified): 1)  Lamictal 100 Mg Tabs (Lamotrigine) .Marland Kitchen.. 1 Tab By Mouth Daily 2)  Divalproex Sodium 500 Mg Tbec (Divalproex Sodium) .... Take 2 Tablets By Mouth At Bedtime 3)  Wellbutrin Xl 300 Mg Xr24h-Tab (Bupropion Hcl) .... Take 1 Tablet By Mouth Once A Day 4)  Synthroid 25 Mcg Tabs (Levothyroxine Sodium) .Marland Kitchen.. 1 Tab By Mouth Daily 5)  Fioricet 50-325-40 Mg Tabs (Butalbital-Apap-Caffeine) .Marland Kitchen.. 1 Tab Q 4 Hr As Needed Headache.  Rx From [facility] Discharge 09/2008 6)  Simvastatin 80 Mg Tabs (Simvastatin) .Marland Kitchen.. 1 Tab By Mouth Daily 7)  Inderal La 60 Mg Xr24h-Cap (Propranolol Hcl) .... Per Neuro 8)  Naproxen 250 Mg Tabs (Naproxen) .Marland Kitchen.. 1 Tab By Mouth Two Times A Day For Pain.  Take First Dose At Bed Time Tonight. 9)  Hydrocodone-Acetaminophen 5-325 Mg Tabs (Hydrocodone-Acetaminophen) .Marland Kitchen.. 1-2 Tab By Mouth 4-5 Times Per Day For Pain 10)  Alprazolam  0.25 Mg Tabs (Alprazolam) .Marland Kitchen.. 1 Tab By Mouth Three Times A Day As Needed Anxiety  Allergies (verified): 1)  ! Codeine  Past History:  Past Surgical History: Last updated: 10/23/2008 out patient sinus surgery heart cath. 09/08 - clean treadmill stress test 2009: normal  Family History: Last updated: 06/25/2009 Family History of Alcoholism/Addiction (mom and dad) Family History of Arthritis Family History of  "female cancer" (ovarian) Family History Lung cancer (maternal uncle was smoker) Family History of  prostate cancer (paternal uncle) Family History High cholesterol (mom) Family History Hypertension (mom) Family History of  stroke (mom) Family History of  CAD / MI (mom, aunt, mat grandmother) Family History of  emotional/mental illness Family History of  diabetes  Social History: Last updated: 06/25/2009 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  Lives with [place], Nadine Counts. No children No tobacco, No drugs, Occasional alcohol Lesbian   Risk Factors: Alcohol Use: 2 (10/23/2008) >5 drinks/d w/in last 3 months: no (10/23/2008) Caffeine Use: 2-3 - told to cut back (01/02/2008) Exercise: no (12/10/2007)  Risk Factors: Smoking Status: never (06/25/2009) Passive Smoke Exposure: yes (12/10/2007)  Past Medical History: Admitted [facility] 09/2008: seizure activity thougth to be from Lamictal withdrawal.  Seen at [facility] by Dr Terrace Arabia of [facility]  [phone] CT head 10/03/08: no acute intracranial activity EEG 09/2008: normal awake EEG Hypothyroidism Hyperlipidemia past h/o alcoholism - last binge 2001 - still drinks occasionally, but "not to excess" heart murmur - since childhood UTI's Heart attack 09/10/95; 09/2006 cath clean per patient - thought to be from coronary spasm.  Followd by [facility] Cardiologist, Dr Katrinka Blazing Treadmill stress test 2009: normal per pt bi-polar (sees Dr Tomasa Rand, monthly appt) anxiety Depression 50% deaf in both ears  Personal history of abnormal  pap  Family History: Family History of Alcoholism/Addiction (mom and dad) Family History of Arthritis Family History of  "female cancer" (ovarian) Family History Lung cancer (maternal uncle was smoker) Family History of  prostate cancer (paternal uncle) Family History High cholesterol (mom) Family History Hypertension (mom) Family History of  stroke (mom) Family History of  CAD / MI (mom, aunt, mat grandmother) Family History of  emotional/mental illness Family History of  diabetes  Social History: Occupation: Agricultural engineer at [employer].  Lives with [place], Nadine Counts. No children No tobacco, No drugs, Occasional alcohol Lesbian   Review of Systems       per hpi  Physical Exam  General:  Well-developed,well-nourished,in no acute distress; alert,appropriate and cooperative throughout examination. vitals reviewed Genitalia:  Normal introitus for age, no external lesions, no vaginal discharge, mucosa pink and moist, no vaginal or cervical lesions, no vaginal atrophy, no friaility or hemorrhage, normal uterus size and position, no adnexal masses or tenderness Psych:  Cognition and judgment appear intact. Alert and cooperative with normal attention span and concentration. No apparent delusions, illusions, hallucinationsnormally interactive, good eye contact, not agitated, not suicidal, not homicidal, and tearful.     Impression & Recommendations:  Problem # 1:  ROUTINE GYNECOLOGICAL EXAMINATION (ICD-V72.31) Assessment Unchanged Pap done today.  Pt states history of endometrial bx for abnormal pap.  Family history of ovarian cancer.  Will need yearly pap. Orders: Pap Smear- [facility] (Pap) Pap Smear-[facility] ([id number])  Problem # 2:  ANXIETY (ICD-300.00) Assessment: New  She is still on Disability from her Left shoulder surgery.  Her mother is dying, she just recently changed her mother's code status to DNR.  She has moved her mother to her apt complex so that she can care for her.  Feeling  overwhelmed.  She has appt with Psychiatrist, Dr Althea Charon, at end of this month.  Will give her xanax for short term relief.  She is to continue welbutrin.   Her updated medication list for this problem includes:    Wellbutrin Xl 300 Mg Xr24h-tab (Bupropion hcl) .Marland Kitchen... Take 1 tablet by mouth once a day    Alprazolam 0.25 Mg Tabs (Alprazolam) .Marland Kitchen... 1 tab by mouth three times a day as needed anxiety  Complete Medication List: 1)  Lamictal 100 Mg Tabs (Lamotrigine) .Marland Kitchen.. 1 tab by mouth daily 2)  Divalproex Sodium 500 Mg Tbec (Divalproex sodium) .... Take 2 tablets by mouth at bedtime 3)  Wellbutrin Xl 300 Mg Xr24h-tab (Bupropion hcl) .... Take 1 tablet by mouth once a day 4)  Synthroid 25 Mcg Tabs (Levothyroxine sodium) .Marland Kitchen.. 1 tab by mouth daily 5)  Fioricet 50-325-40 Mg Tabs (Butalbital-apap-caffeine) .Marland Kitchen.. 1 tab q 4 hr as needed headache.  rx from [facility] discharge 09/2008 6)  Simvastatin 80 Mg Tabs (Simvastatin) .Marland Kitchen.. 1 tab by mouth daily 7)  Inderal La 60 Mg Xr24h-cap (Propranolol hcl) .... Per neuro 8)  Naproxen 250 Mg Tabs (Naproxen) .Marland Kitchen.. 1 tab by mouth two times a day for pain.  take first  dose at bed time tonight. 9)  Hydrocodone-acetaminophen 5-325 Mg Tabs (Hydrocodone-acetaminophen) .Marland Kitchen.. 1-2 tab by mouth 4-5 times per day for pain 10)  Alprazolam 0.25 Mg Tabs (Alprazolam) .Marland Kitchen.. 1 tab by mouth three times a day as needed anxiety  Other Orders: Lipid-[facility] ([id number]) TSH-[facility] ([id number]) Comp Met-[facility] ([id number]) CBC-[facility] (29528) [facility]- Est Level  3 (41324)  Patient Instructions: 1)  Please schedule a follow-up appointment in 6 weeks for f/u of psych appt.  Prescriptions: ALPRAZOLAM 0.25 MG TABS (ALPRAZOLAM) 1 tab by mouth three times a day as needed anxiety  #45 x 0   Entered and Authorized by:   Angeline Slim MD   Signed by:   Angeline Slim MD on 06/25/2009   Method used:   Telephoned to ...       [facility]* (retail)       [address]       [address]  Shipping/mailing       [place], [place]  [address]       Ph: [phone]       Fax: [phone]   RxID:   [id number]

## 2010-02-16 NOTE — Letter (Signed)
Summary: Out of School  [facility]  [address]   [place], [place] [address]   Phone: [phone]  Fax: [phone]    September 09, 2009   Student:  [employer]    To Whom It May Concern:   For Medical reasons, please excuse the above named student from school for the following dates:  Start:   September 09, 2009  End:    Can go back to work on 09/10/2009  If you need additional information, please feel free to contact our office.   Sincerely,    Cat Ta MD    ****This is a legal document and cannot be tampered with.  Schools are authorized to verify all information and to do so accordingly.

## 2010-02-16 NOTE — Assessment & Plan Note (Signed)
Summary: shoulder pain-see notes/[facility]/Ta   Vital Signs:  Patient profile:   45 year old female Height:      66.5 inches Weight:      221.3 pounds BMI:     35.31 Temp:     98.1 degrees F oral BP sitting:   128 / 78  (right arm) Cuff size:   regular  Vitals Entered By: Garen Grams LPN (March 03, 2009 11:05 AM) CC: left shoulder pain Is Patient Diabetic? No Pain Assessment Patient in pain? yes     Location: left shoulder Intensity: 5   Primary Care Provider:  Cat Ta MD  CC:  left shoulder pain.  History of Present Illness: Patient is here today for c/o persistent L shoulder pain with very limited ROM.  Patient states pain is still keeping her up at night, worse in the morning.  Shoulder is very tender to touch, posture of L shoulder slumped/hunched over, no apparent swelling.  Patient states hydrocodone helping somewhat, but Naproxen worked better for pain (and "helped her sleep through the night").  Review of Dr. Mack Hook notes state plan was for MRI next if pain not improved.  Patient did have a steroid injection which only helped for 1 day, then pain returned.    Patient also needed FMLA paper work for h/o psychiatric disorder.  Paperwork was given to her today.  Habits & Providers  Alcohol-Tobacco-Diet     Tobacco Status: never  Allergies: 1)  ! Codeine  Physical Exam  General:  Well-developed,well-nourished, tearful; alert, cooperative throughout examination Extremities:  R shoulder with full ROM, L shoulder with hunched posturing, very tender to touch over entire area.  Forward flexion to 90 deg but very painful, Abduction limited to 45 deg   Impression & Recommendations:  Problem # 1:  ADHESIVE CAPSULITIS OF SHOULDER (ICD-726.0) Will check MRI and refer to Ortho.  Not responding well to NSAIDs, or cortisone injection.  May need surgical intervention at this point.  Orders: Orthopedic Referral (Ortho) MRI with & without Contrast (MRI w&w/o Contrast) [facility]- Est  Level  3 ([address])  Complete Medication List: 1)  Lamictal 200 Mg Tabs (Lamotrigine) .Marland Kitchen.. 1 tab by mouth daily 2)  Divalproex Sodium 500 Mg Tbec (Divalproex sodium) .... Take 2 tablets by mouth at bedtime 3)  Wellbutrin Xl 300 Mg Xr24h-tab (Bupropion hcl) .... Take 1 tablet by mouth once a day 4)  Synthroid 25 Mcg Tabs (Levothyroxine sodium) .Marland Kitchen.. 1 tab by mouth daily 5)  Fioricet 50-325-40 Mg Tabs (Butalbital-apap-caffeine) .Marland Kitchen.. 1 tab q 4 hr as needed headache.  rx from [facility] discharge 09/2008 6)  Simvastatin 80 Mg Tabs (Simvastatin) .Marland Kitchen.. 1 tab by mouth daily 7)  Inderal La 60 Mg Xr24h-cap (Propranolol hcl) .... Per neuro 8)  Naproxen 250 Mg Tabs (Naproxen) .Marland Kitchen.. 1 tab by mouth two times a day for pain.  take first dose at bed time tonight. 9)  Hydrocodone-acetaminophen 5-325 Mg Tabs (Hydrocodone-acetaminophen) .Marland Kitchen.. 1-2 tab by mouth 4-5 times per day for pain Prescriptions: NAPROXEN 250 MG TABS (NAPROXEN) 1 tab by mouth two times a day for pain.  Take first dose at bed time tonight.  #60 x 1   Entered and Authorized by:   Jettie Pagan MD   Signed by:   Jettie Pagan MD on 03/03/2009   Method used:   Electronically to        [facility]* (retail)       [address]  [address] Shipping/mailing       [place], [place]  [address]       Ph: [phone]       Fax: [phone]   RxID:   [id number]

## 2010-02-16 NOTE — Progress Notes (Signed)
Summary: triage  Phone Note Call from Patient Call back at Home Phone [phone]   Caller: Patient Summary of Call: pt shoulder is killing her and wants to know what to do.  also wants to know about paperwork for FMLA Initial call taken by: De Nurse,  March 02, 2009 9:54 AM  Follow-up for Phone Call        states she is taking the pain pills 2 at a time. the pain awakens her & she is taking more. the other med does help her fall asleep. wants to know what can be done next?. work in with Dr. Claudius Sis tomorrow am. she works in ED at [employer]. wants to know status of her flma papers. they ar enot in pcp chart box. asked pt to call fmla people at [facility] & have them fax a copt to me so the md who is seeing her can do them Follow-up by: Golden Circle RN,  March 02, 2009 10:49 AM  Additional Follow-up for Phone Call Additional follow up Details #1::        she states the fmla papers have to be in by tomorrow & she does not think the md who is seeing her tomorrow can do them. states it is due to her bipolar issues. agreed that Dr. Janalyn Harder will need to do them. she will have them faxed or will bring her with her. told her pcp will be back in on Wed pm & I will see that they get done. to relay this to fmla specialist at [facility] Additional Follow-up by: Golden Circle RN,  March 02, 2009 11:35 AM    Additional Follow-up for Phone Call Additional follow up Details #2::    pcp wrote back that she did them last week & put them in the "to be called pile" we are looking for them now Follow-up by: Golden Circle RN,  March 03, 2009 9:59 AM  I filled out FMLA forms for pt last week.  I placed them in the "to be called" box so that pt can be called to pick them up from [facility].  I knew that they were due on 2/15.  Cat Ta MD  March 02, 2009 8:45 PM   Patient seen today in clinic.  Very limited ROM due to pain. MRI ordered.  Naproxen refilled at patient's request (states helps with sleep?).  Also  referred to Ortho for surgical evaluation for possible adhesive capsulitis. Also gave patient FMLA forms.   Jettie Pagan MD

## 2010-02-16 NOTE — Progress Notes (Signed)
Summary: Psych referral, Dr Nolen Mu  Phone Note Outgoing Call   Summary of Call: I would like to refer pt to Dr Nolen Mu for psychiatric care.  Spoke to [employer], Dr Loralie Champagne office Production designer, theatre/television/film.  Pt has to call and make appt herself.  Down payment for first visit is $140.  I called pt and left message on her voicemail with Dr Loralie Champagne contact information.   Emerson Monte, MD [email or web address] [address] [place] [phone]   $140 down payment due at first visit.   Joy Initial call taken by: Angeline Slim MD,  May 25, 2009 4:21 PM

## 2010-02-16 NOTE — Progress Notes (Signed)
Summary: labs  Phone Note Call from Patient Call back at Home Phone [phone]   Caller: Patient Summary of Call: pt needs to add a depakote level to her labs - for Dr Nolen Mu - [phone] if you need to talk to her Initial call taken by: De Nurse,  November 04, 2009 1:54 PM    Done.  Cat Ta MD  November 04, 2009 2:00 PM

## 2010-02-16 NOTE — Discharge Summary (Signed)
Summary: Discharge Summary 9/16 - 10/06/08  NAME:  ADEA, GEISEL          ACCOUNT NO.:  [id number]      MEDICAL RECORD NO.:  [id number]          PATIENT TYPE:  INP      LOCATION:  4729                         FACILITY:  [facility]      PHYSICIAN:  Eduard Clos, MDDATE OF BIRTH:  1965/02/14      DATE OF ADMISSION:  10/02/2008   DATE OF DISCHARGE:  10/06/2008                                  DISCHARGE SUMMARY      PRIMARY CARE PHYSICIAN:  Dr. Mariann Laster.      COURSE IN THE HOSPITAL:  A 45 year old female with known history of   hypothyroidism, bipolar disorder came with complaints of feeling weak   and having tremors.  The patient on admission had a CT head which was   negative and subsequently underwent EEG which results are still pending.      The patient states she did not take her Lamictal.  She ran out of   prescription for 2 weeks and was restarted on Lamictal and a Neurology   consult was obtained per neurologist.  The patient's symptoms were   compatible with Lamictal withdrawal.  At this time, the patient's   symptoms have largely improved.  The patient also did have some headache   for which she was getting relief from Fioricet.  At this time, the   symptoms were largely resolved.  The patient was discharged home.  The   patient at the time of the dictation is hemodynamically stable.      PROCEDURES DONE DURING THIS STAY:   1. CT of the head without contrast on October 03, 2008 showed       nothing acute.   2. Chest x-ray on October 03, 2008 showed hypoexpanded lungs with       bibasilar atelectasis.      PERTINENT LABORATORY DATA:   1. The patient's WBC count was 10.4.  cardiac enzymes were negative.       Valproic acid level was 35.8.  TSH was 2.8.   2. EEG results are still pending.      FINAL DIAGNOSES:   1. Tremors and weakness from Lamictal withdrawal.   2. Bipolar disorder with no suicidal ideation.   3. Hypothyroidism.      MEDICATIONS AT DISCHARGE:     1. Fioricet 1 tablet p.o. q. 4 p.r.n. for pain, headache.   2. Lamictal 200 mg p.o. daily.   3. Depakote ER 500 mg takes 1000 mg alternating with 1500 mg every       other day.   4. Wellbutrin 75 mg p.o. daily.   5. Synthroid 25 mcg p.o. daily.      PLAN:   1. The patient to follow with the primary care physician within a       week's time.  The patient to call [facility] at [phone]-       [phone] and make an appointment for followup with Dr. Orvis Brill.  The       patient advised not to drive until seen by primary care physician  within a week's time.   2. The patient to be compliant with the medication.  In the event of       any recurrence of symptoms to go to the nearest ER.      DIET:  Unrestricted.               Eduard Clos, MD

## 2010-02-16 NOTE — Letter (Signed)
Summary: Out of Work  [facility]  [address]   [place], [place] [address]   Phone: [phone]  Fax: [phone]    February 10, 2009   Employee:  IKESHA SILLER [employer]    To Whom It May Concern:   For Medical reasons, please excuse the above named employee from work for the following dates:  Start:   February 09, 2009  End:   February 16, 2009  If you need additional information, please feel free to contact our office.         Sincerely,    Juliauna Stueve MD

## 2010-02-16 NOTE — Progress Notes (Signed)
Summary: Note Needed  Phone Note Call from Patient Call back at Home Phone [phone]   Caller: Patient Summary of Call: Pt said her job wants a note stating that she can come back to work on 02/16/09.  Can be faxed to [facility] [phone].   Initial call taken by: Clydell Hakim,  February 12, 2009 3:05 PM  Follow-up for Phone Call        faxed note. called pt and lmvm: note faxed. Follow-up by: Arlyss Repress CMA,,  February 12, 2009 3:58 PM

## 2010-02-16 NOTE — Miscellaneous (Signed)
Summary: medical record request  Clinical Lists Changes  Rec'd medical record request to go to [facility] Faxed 09/15/09 Marily Memos  December 16, 2009 10:39 AM

## 2010-02-16 NOTE — Assessment & Plan Note (Signed)
Summary: shoulder pain,df   Vital Signs:  Patient profile:   45 year old female Height:      66.5 inches Weight:      225 pounds BMI:     35.90 Temp:     98.5 degrees F oral Pulse rate:   89 / minute BP sitting:   113 / 73  (right arm) Cuff size:   large  Vitals Entered By: Tessie Fass, CMA CC: left shoulder pain x 5 days Is Patient Diabetic? No Pain Assessment Patient in pain? yes     Location: shoulder Intensity: 10   Primary Care Provider:  Cheron Coryell MD  CC:  left shoulder pain x 5 days.  History of Present Illness: cc: shoulder pain (left) since Thurs  Left shoulder pain since Thurs.  Denies trauma.  Pt works in [employer] and was given a rx for flexeril on Fri.  Flexeril did not help.  Pt went to ED Sat due to swelling.  Xray showed no fx or subluxation.  Rx for vicodin given by ED helps pain, but wakes pt up from sleep in pain.  When she wakes up pain is excrutiating.  Pain in shoulder.  NO pain in neck.  No limitation to neck movement.   Habits & Providers  Alcohol-Tobacco-Diet     Tobacco Status: never  Allergies: 1)  ! Codeine  Physical Exam  General:  Well-developed,well-nourished,in no acute distress; alert,appropriate and cooperative throughout examination. vitals reviewed.    Shoulder/Elbow Exam  Inspection:    left shoulder = right shoulder.  NO swelling.  Palpation:    tenderness L-AC:, tenderness L-deltoid:, and tenderness L-suprascapular:Marland Kitchen    Vascular:    radial pulses normal  Sensory:    sensation intact in left arm and forearm.    Motor:    L hand grip normal, but causes much pain.  Internal roation and external rotation of left arm full rom, but causes much pain.  Pt is in tears.  Shoulder Exam:    Left:    Inspection:  Normal    Swelling:  no    Erythema:  no    Very painful AC joint, deltoid, and suprascapular areas.  No swelling.  No redness.  ROM intact but very painful.     Impression & Recommendations:  Problem #  1:  SHOULDER PAIN (ICD-719.41) Assessment New Left shoulder pain very tender.  Rotator cuff injury?Exam is limited due to pain.  I am concerned that pt is behind on pain control, causing her to be in excruciating pain.  We have given her Toradol 30mg  IM x 1.  Will give longterm pain control with naproxen 250mg  two times a day (pt to start first dose bee bed tonight).  Will give vicodin for break through pain.  When pain is better control we will repeat exam.  MRI is not warranted at this time as pt's pain prevents her from lying still for MRI.  Will f/u in 1 wk. Her updated medication list for this problem includes:    Fioricet 50-325-40 Mg Tabs (Butalbital-apap-caffeine) .Marland Kitchen... 1 tab q 4 hr as needed headache.  rx from [facility] discharge 09/2008    Naproxen 250 Mg Tabs (Naproxen) .Marland Kitchen... 1 tab by mouth two times a day for pain.  take first dose at bed time tonight.    Hydrocodone-acetaminophen 5-325 Mg Tabs (Hydrocodone-acetaminophen) .Marland Kitchen... 1-2 tab by mouth 4-5 times per day for pain  Orders: Ketorolac-Toradol 15mg  (E4540)  Complete Medication List: 1)  Lamictal  200 Mg Tabs (Lamotrigine) .Marland Kitchen.. 1 tab by mouth daily 2)  Divalproex Sodium 500 Mg Tbec (Divalproex sodium) .... Take 2 tablets by mouth alternating daily with 3 tablets at bedtime. 3)  Wellbutrin Xl 300 Mg Xr24h-tab (Bupropion hcl) .... Take 1 tablet by mouth once a day 4)  Synthroid 25 Mcg Tabs (Levothyroxine sodium) .Marland Kitchen.. 1 tab by mouth daily 5)  Fioricet 50-325-40 Mg Tabs (Butalbital-apap-caffeine) .Marland Kitchen.. 1 tab q 4 hr as needed headache.  rx from [facility] discharge 09/2008 6)  Simvastatin 80 Mg Tabs (Simvastatin) .Marland Kitchen.. 1 tab by mouth daily 7)  Inderal La 60 Mg Xr24h-cap (Propranolol hcl) .... Per neuro 8)  Naproxen 250 Mg Tabs (Naproxen) .Marland Kitchen.. 1 tab by mouth two times a day for pain.  take first dose at bed time tonight. 9)  Hydrocodone-acetaminophen 5-325 Mg Tabs (Hydrocodone-acetaminophen) .Marland Kitchen.. 1-2 tab by mouth 4-5 times per day for pain  Patient  Instructions: 1)  Please schedule a follow-up appointment in 1 week for Left shoulder pain.  2)  I want you to take scheduled medicine for pain so that you don't get behind on pain.   3)  You can take Naproxen 250mg  two times a day. 4)  You can also take Hydrocodone-Acetaminophen 5/325, 1-2 tab 4-5 times a day. Prescriptions: HYDROCODONE-ACETAMINOPHEN 5-325 MG TABS (HYDROCODONE-ACETAMINOPHEN) 1-2 tab by mouth 4-5 times per day for pain  #45 x 0   Entered and Authorized by:   Angeline Slim MD   Signed by:   Angeline Slim MD on 02/10/2009   Method used:   Historical   RxID:   [id number] NAPROXEN 250 MG TABS (NAPROXEN) 1 tab by mouth two times a day for pain.  Take first dose at bed time tonight.  #60 x 1   Entered and Authorized by:   Angeline Slim MD   Signed by:   Angeline Slim MD on 02/10/2009   Method used:   Electronically to        [facility]* (retail)       [address]       [address] Shipping/mailing       [place], [place]  [address]       Ph: [phone]       Fax: [phone]   RxID:   [id number]    Medication Administration  Injection # 1:    Medication: Ketorolac-Toradol 15mg     Diagnosis: SHOULDER PAIN (ICD-719.41)    Route: IM    Site: R deltoid    Exp Date: 05/18/2010    Lot #: [id number]    Mfr: [email or web address]    Comments: 30 mg given per dr.Ramzi Brathwaite    Patient tolerated injection without complications    Given by: Arlyss Repress CMA, (February 10, 2009 10:23 AM)  Orders Added: 1)  Ketorolac-Toradol 15mg  [Y6063]

## 2010-02-16 NOTE — Miscellaneous (Signed)
Summary: [facility]   [facility]   Imported By: Clydell Hakim 02/06/2009 11:00:37  _____________________________________________________________________  External Attachment:    Type:   Image     Comment:   External Document

## 2010-02-16 NOTE — Miscellaneous (Signed)
Summary: re: cardiology appt  Clinical Lists Changes called pt lmom to return call. has appt with Dr Halina [facility] on 09/22/09 @ 2:30.Molly Maduro [facility] CMA  September 10, 2009 10:56 AM.  spoke with pt, notified of appt.Tessie Fass CMA  September 10, 2009 11:47 AM

## 2010-02-16 NOTE — Consult Note (Signed)
Summary: [facility]   Imported By: Clydell Hakim 06/19/2009 15:15:38  _____________________________________________________________________  External Attachment:    Type:   Image     Comment:   External Document

## 2010-02-16 NOTE — Progress Notes (Signed)
Summary: waiting for pt to call back. 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 to dr.ta Follow-up by: Arlyss Repress CMA,,  February 12, 2009 4:57 PM    Called pt at home number to discuss above message.  When I saw pt in clinic on 1/25 she was not able to do any of above without excruciating pain, causing tears.  I've asked pt to call back to tell me what her Left arm mobility is currently.  Cat Ta MD  February 12, 2009 6:23 PM

## 2010-02-16 NOTE — Assessment & Plan Note (Signed)
Summary: f/u shoulder pain/eo   Vital Signs:  Patient profile:   45 year old female Height:      66.5 inches Weight:      220.50 pounds BMI:     35.18 Temp:     98.4 degrees F oral Pulse rate:   86 / minute BP sitting:   116 / 83  (right arm)  Vitals Entered By: Terese Door (February 18, 2009 2:19 PM) CC: F/U left shoulder pain Is Patient Diabetic? No Pain Assessment Patient in pain? yes     Location: shoulder Intensity: 7 Type: aching   Primary Care Provider:  Tarri Guilfoil MD  CC:  F/U left shoulder pain.  History of Present Illness: cc: L shoulder pain  45 y/o F returns for follow-up of L shoulder pain.  Pt has taken 45 tabs of vicodin that was prescribed on 02/10/09 OV.  Pain is still present, but may be a little less than previous.  She is still unable to use the left arm for any amt of lifting.  She went back to work yesterday and worked 4 hrs.  She did not use her left arm at all.  She is righ-hand dominant.  She is scheduled to go back to work this week for two 12-hr shifts.    Habits & Providers  Alcohol-Tobacco-Diet     Tobacco Status: never  Current Medications (verified): 1)  Lamictal 200 Mg Tabs (Lamotrigine) .Marland Kitchen.. 1 Tab By Mouth Daily 2)  Divalproex Sodium 500 Mg Tbec (Divalproex Sodium) .... Take 2 Tablets By Mouth At Bedtime 3)  Wellbutrin Xl 300 Mg Xr24h-Tab (Bupropion Hcl) .... Take 1 Tablet By Mouth Once A Day 4)  Synthroid 25 Mcg Tabs (Levothyroxine Sodium) .Marland Kitchen.. 1 Tab By Mouth Daily 5)  Fioricet 50-325-40 Mg Tabs (Butalbital-Apap-Caffeine) .Marland Kitchen.. 1 Tab Q 4 Hr As Needed Headache.  Rx From [facility] Discharge 09/2008 6)  Simvastatin 80 Mg Tabs (Simvastatin) .Marland Kitchen.. 1 Tab By Mouth Daily 7)  Inderal La 60 Mg Xr24h-Cap (Propranolol Hcl) .... Per Neuro 8)  Naproxen 250 Mg Tabs (Naproxen) .Marland Kitchen.. 1 Tab By Mouth Two Times A Day For Pain.  Take First Dose At Bed Time Tonight. 9)  Hydrocodone-Acetaminophen 5-325 Mg Tabs (Hydrocodone-Acetaminophen) .Marland Kitchen.. 1-2 Tab By Mouth 4-5 Times Per  Day For Pain  Allergies (verified): 1)  ! Codeine  Physical Exam  General:  Well-developed,well-nourished,in no acute distress; alert,appropriate and cooperative throughout examination.vitals reviewed   Shoulder/Elbow Exam  Skin:    Intact, no scars, lesions, rashes, cafe au lait spots or bruising.    Inspection:    Inspection is normal.    Shoulder Exam:    Left:    Inspection:  Normal    Palpation:  Normal    Stability:  stable    Tenderness:  Tenderness at [facility] joint and along deltoid    Swelling:  no    Erythema:  no    Pt can perform passive ROM exercises, but it causes great pain.  Internal and external rotation of arm causes pain.   Impression & Recommendations:  Problem # 1:  SHOULDER PAIN (ICD-719.41) Assessment Unchanged Precepted with Dr Pearletha Forge.  Left shoulder pain can be from rotator cuff injury or bursitis leading to frozen shoulder.  We will try injection with 40% Kenolog + Marcaine.  If pt does not improve in two weeks we can get MRI to evaluate further.  Pt also given passive shoulder exercises to do at home.  Rx for hydrocodone/acetaminophen 5/325 given.  Her updated medication list for this problem includes:    Naproxen 250 Mg Tabs (Naproxen) .Marland Kitchen... 1 tab by mouth two times a day for pain.  take first dose at bed time tonight.    Hydrocodone-acetaminophen 5-325 Mg Tabs (Hydrocodone-acetaminophen) .Marland Kitchen... 1-2 tab by mouth 4-5 times per day for pain  Orders: [facility]- Est Level  3 ([address]) Injection, large joint- [facility] (32440)  Complete Medication List: 1)  Lamictal 200 Mg Tabs (Lamotrigine) .Marland Kitchen.. 1 tab by mouth daily 2)  Divalproex Sodium 500 Mg Tbec (Divalproex sodium) .... Take 2 tablets by mouth at bedtime 3)  Wellbutrin Xl 300 Mg Xr24h-tab (Bupropion hcl) .... Take 1 tablet by mouth once a day 4)  Synthroid 25 Mcg Tabs (Levothyroxine sodium) .Marland Kitchen.. 1 tab by mouth daily 5)  Fioricet 50-325-40 Mg Tabs (Butalbital-apap-caffeine) .Marland Kitchen.. 1 tab q 4 hr as needed headache.   rx from [facility] 09/2008 6)  Simvastatin 80 Mg Tabs (Simvastatin) .Marland Kitchen.. 1 tab by mouth daily 7)  Inderal La 60 Mg Xr24h-cap (Propranolol hcl) .... Per neuro 8)  Naproxen 250 Mg Tabs (Naproxen) .Marland Kitchen.. 1 tab by mouth two times a day for pain.  take first dose at bed time tonight. 9)  Hydrocodone-acetaminophen 5-325 Mg Tabs (Hydrocodone-acetaminophen) .Marland Kitchen.. 1-2 tab by mouth 4-5 times per day for pain Prescriptions: HYDROCODONE-ACETAMINOPHEN 5-325 MG TABS (HYDROCODONE-ACETAMINOPHEN) 1-2 tab by mouth 4-5 times per day for pain  #30 x 0   Entered and Authorized by:   Angeline Slim MD   Signed by:   Angeline Slim MD on 02/18/2009   Method used:   Telephoned to ...       [facility]* (retail)       [address]       [address] Shipping/mailing       [place], [place]  [address]       Ph: [phone]       Fax: [phone]   RxID:   [id number] DIVALPROEX SODIUM 500 MG TBEC (DIVALPROEX SODIUM) take 2 tablets by mouth at bedtime  #66 x 6   Entered and Authorized by:   Angeline Slim MD   Signed by:   Angeline Slim MD on 02/18/2009   Method used:   Electronically to        [facility]* (retail)       [address]       [address] Shipping/mailing       [place], [place]  [address]       Ph: [phone]       Fax: [phone]   RxID:   [id number]   Appended Document: f/u shoulder pain/eo     Allergies: 1)  ! Codeine   Complete Medication List: 1)  Lamictal 200 Mg Tabs (Lamotrigine) .Marland Kitchen.. 1 tab by mouth daily 2)  Divalproex Sodium 500 Mg Tbec (Divalproex sodium) .... Take 2 tablets by mouth at bedtime 3)  Wellbutrin Xl 300 Mg Xr24h-tab (Bupropion hcl) .... Take 1 tablet by mouth once a day 4)  Synthroid 25 Mcg Tabs (Levothyroxine sodium) .Marland Kitchen.. 1 tab by mouth daily 5)  Fioricet 50-325-40 Mg Tabs (Butalbital-apap-caffeine) .Marland Kitchen.. 1 tab q 4 hr as needed headache.  rx from [facility] discharge 09/2008 6)  Simvastatin 80 Mg Tabs (Simvastatin) .Marland Kitchen.. 1 tab by mouth daily 7)   Inderal La 60 Mg Xr24h-cap (Propranolol hcl) .... Per neuro 8)  Naproxen 250 Mg Tabs (Naproxen) .Marland Kitchen.. 1 tab by mouth two times a day  for pain.  take first dose at bed time tonight. 9)  Hydrocodone-acetaminophen 5-325 Mg Tabs (Hydrocodone-acetaminophen) .Marland Kitchen.. 1-2 tab by mouth 4-5 times per day for pain  Other Orders: [facility]- Est Level  3 (40981) Injection, large joint- [facility] ([address])

## 2010-02-26 ENCOUNTER — Ambulatory Visit (INDEPENDENT_AMBULATORY_CARE_PROVIDER_SITE_OTHER): Payer: Self-pay | Admitting: Family Medicine

## 2010-02-26 ENCOUNTER — Encounter: Payer: Self-pay | Admitting: Family Medicine

## 2010-02-26 VITALS — BP 124/72 | HR 72

## 2010-02-26 DIAGNOSIS — M79601 Pain in right arm: Secondary | ICD-10-CM

## 2010-02-26 DIAGNOSIS — M79609 Pain in unspecified limb: Secondary | ICD-10-CM

## 2010-02-26 NOTE — Progress Notes (Signed)
  Subjective:    Patient ID: Krista Young, female    DOB: [birth year], 45 y.o.   MRN: [id number]  Arm Pain  The incident occurred more than 1 week ago. There was no injury mechanism. The pain is present in the right elbow and right hand. The quality of the pain is described as cramping and aching. The pain does not radiate. The pain is at a severity of 10/10. The pain is severe. The pain has been intermittent since the incident. Associated symptoms include numbness. Pertinent negatives include no chest pain. The symptoms are aggravated by nothing. She has tried NSAIDs for the symptoms. The treatment provided mild relief.   R arm x 2 wks.  Denies injury.  Started out as back ache and worsened.  R arm pain causes R hand tingling and numbness.  Able to lift arm and work.  When pain is present, she cannot write.  She is taking Ibuprofen 800 x 4 tablets at one time yesterday.  Ibuprofen eases the pain, but relief is only for one hour.  She cannot sleep well due to pain.  She does endorse some swelling, but never redness.  She tried ice and heat with minimal effect.   Tingling--> pain  Review of Systems  Cardiovascular: Negative for chest pain.  Neurological: Positive for numbness.   No fever, chills, rash.      Objective:   Physical Exam  Constitutional: She is oriented to person, place, and time. She appears well-developed. She appears distressed.  Neck: Normal range of motion. Neck supple.  Musculoskeletal: Normal range of motion.       Right shoulder: She exhibits tenderness, pain and decreased strength. She exhibits normal range of motion, no bony tenderness, no swelling, no effusion, no crepitus, no deformity, no laceration, no spasm and normal pulse.  Neurological: She is alert and oriented to person, place, and time.  Negative Tinel sign. Positive Phalanx sign.         Assessment & Plan:

## 2010-02-26 NOTE — Assessment & Plan Note (Addendum)
R arm and hand pain with accompanying tingling and numbness to R hand.  Pt has been taking Ibuprofen 800mg  almost every hour.  Discussed that this is harmful to her kidneys.  She is already taking gabapentin 400mg  in AM and 800mg  in PM for mood.  I have asked her to increase this to 800mg  tid for the next 2 weeks.   Will use wrist brace to stabilize wrist while asleep and during day.  Gave pt ProLite Stabilizing Wrist Brace.   Will prevent entrapment of nerve while sleeping by using a wrapped towel around elbow to prevent flexion of elbow.  Will order EMG of R arm/hand for further evaluation as pain seems to have neuropathic quality. Will need to see neurologist for this.  Pt has seen Dr Terrace Arabia, [facility], in the past.  Will refer pt back to Dr Terrace Arabia for EMG.

## 2010-02-26 NOTE — Patient Instructions (Signed)
Your R arm pain has a neuropathic quality to it.  We may need to get an EMG for diagnosis.  You have seen Dr Terrace Arabia, [facility], in the past.  It would be best for you to go back to her for the EMG.   In the meantime, for pain, you can increase the dose of the Gabapentin to 800mg  (2 capsules) by mouth three times each day.  This may make you drowsy.

## 2010-04-02 LAB — POCT CARDIAC MARKERS
CKMB, poc: <1 ng/mL — ABNORMAL LOW (ref 1.0–8.0)
Myoglobin, poc: 38.9 ng/mL (ref 12–200)
Myoglobin, poc: 47.9 ng/mL (ref 12–200)

## 2010-04-02 LAB — POCT I-STAT, CHEM 8: Creatinine, Ser: 1.2 mg/dL (ref 0.4–1.2)

## 2010-04-20 NOTE — Miscellaneous (Signed)
  Clinical Lists Changes Medical Records Request Records went to Cat Janalyn Harder, MD Family Medicine Date sent/faxed on 01/14/2009 [facility]  January 22, 2010 3:25 PM

## 2010-04-23 LAB — URINE MICROSCOPIC-ADD ON

## 2010-04-23 LAB — DIFFERENTIAL
Basophils Relative: 0 % (ref 0–1)
Eosinophils Absolute: 0.1 10*3/uL (ref 0.0–0.7)
Eosinophils Relative: 1 % (ref 0–5)
Monocytes Absolute: 0.9 10*3/uL (ref 0.1–1.0)
Monocytes Relative: 9 % (ref 3–12)

## 2010-04-23 LAB — BASIC METABOLIC PANEL
BUN: 8 mg/dL (ref 6–23)
CO2: 29 mEq/L (ref 19–32)
Calcium: 8.8 mg/dL (ref 8.4–10.5)
Chloride: 102 mEq/L (ref 96–112)
Creatinine, Ser: 0.94 mg/dL (ref 0.4–1.2)
Sodium: 140 mEq/L (ref 135–145)

## 2010-04-23 LAB — URINALYSIS, ROUTINE W REFLEX MICROSCOPIC
Bilirubin Urine: NEGATIVE
Glucose, UA: NEGATIVE mg/dL
Ketones, ur: NEGATIVE mg/dL
Protein, ur: NEGATIVE mg/dL

## 2010-04-23 LAB — CARDIAC PANEL(CRET KIN+CKTOT+MB+TROPI)
CK, MB: 1.8 ng/mL (ref 0.3–4.0)
CK, MB: 1.9 ng/mL (ref 0.3–4.0)
Relative Index: 1.7 (ref 0.0–2.5)
Total CK: 85 U/L (ref 7–177)
Troponin I: 0.01 ng/mL (ref 0.00–0.06)
Troponin I: 0.02 ng/mL (ref 0.00–0.06)

## 2010-04-23 LAB — CBC
MCHC: 34.7 g/dL (ref 30.0–36.0)
MCV: 95.2 fL (ref 78.0–100.0)
Platelets: 340 10*3/uL (ref 150–400)
RDW: 13.5 % (ref 11.5–15.5)

## 2010-04-23 LAB — TSH: TSH: 2.837 u[IU]/mL (ref 0.350–4.500)

## 2010-04-23 LAB — CK TOTAL AND CKMB (NOT AT ARMC)
Relative Index: INVALID (ref 0.0–2.5)
Total CK: 62 U/L (ref 7–177)

## 2010-04-23 LAB — VALPROIC ACID LEVEL: Valproic Acid Lvl: 35.8 ug/mL — ABNORMAL LOW (ref 50.0–100.0)

## 2010-05-19 ENCOUNTER — Other Ambulatory Visit (HOSPITAL_COMMUNITY): Payer: Self-pay | Admitting: Orthopedic Surgery

## 2010-05-19 DIAGNOSIS — M25539 Pain in unspecified wrist: Secondary | ICD-10-CM

## 2010-05-21 ENCOUNTER — Ambulatory Visit (HOSPITAL_COMMUNITY)
Admission: RE | Admit: 2010-05-21 | Discharge: 2010-05-21 | Disposition: A | Discharge status: Home / Self Care | Payer: 59 | Source: Ambulatory Visit | Attending: Orthopedic Surgery | Admitting: Orthopedic Surgery

## 2010-05-21 DIAGNOSIS — M6740 Ganglion, unspecified site: Secondary | ICD-10-CM | POA: Insufficient documentation

## 2010-05-21 DIAGNOSIS — R609 Edema, unspecified: Secondary | ICD-10-CM | POA: Insufficient documentation

## 2010-05-21 DIAGNOSIS — M19039 Primary osteoarthritis, unspecified wrist: Secondary | ICD-10-CM | POA: Insufficient documentation

## 2010-05-21 DIAGNOSIS — M25539 Pain in unspecified wrist: Secondary | ICD-10-CM | POA: Insufficient documentation

## 2010-06-01 NOTE — Op Note (Signed)
NAMEELLESE, JULIUS          ACCOUNT NO.:  [id number]   MEDICAL RECORD NO.:  [id number]          PATIENT TYPE:  AMB   LOCATION:  DSC                          FACILITY:  [facility]   PHYSICIAN:  Newman Pies, MD            DATE OF BIRTH:  [birth year]   DATE OF PROCEDURE:  10/16/2006  DATE OF DISCHARGE:                               OPERATIVE REPORT   SURGEON:  Newman Pies, MD.   PREOPERATIVE DIAGNOSES:  1. Chronic nasal obstruction.  2. Bilateral inferior turbinate hypertrophy.   POSTOPERATIVE DIAGNOSES:  1. Chronic nasal obstruction.  2. Bilateral inferior turbinate hypertrophy.   PROCEDURE PERFORMED:  Bilateral partial inferior turbinate resection.   ANESTHESIA:  General endotracheal tube anesthesia.   COMPLICATIONS:  None.   ESTIMATED BLOOD LOSS:  10 mL.   INDICATIONS FOR PROCEDURE:  The patient is a 45 year old white female  with a history of chronic nasal obstruction.  She previously underwent  septoplasty with temporary relief of her nasal obstruction.  However,  over the past several years, she has been experiencing chronic nasal  obstruction with difficulty breathing through her nose.  The patient  also has a history of obstructive sleep apnea.  The nasal obstruction  has complicated her use of her CPAP machine.  On examination, the  patient was noted to have bilateral significant inferior turbinate  hypertrophy.  She was previously treated with steroid nasal spray and  nasal saline irrigation.  However, she continues to be symptomatic.  Based on that finding, the decision was made for the patient to undergo  bilateral partial inferior turbinate resection.  The risks, benefits,  alternatives, and details of the procedure were discussed with the  patient.  She would like to proceed with the procedure.  Questions were  invited and answered.  Informed consent was obtained.   DESCRIPTION OF PROCEDURE:  The patient was taken to the operating room  and placed supine on the  operating table.  General endotracheal tube  anesthesia was administered by the anesthesiologist.  The patient was  then prepped and draped in standard fashion for nasal surgery. Pledgets  soaked with Afrin were placed in the nasal cavities for  vasoconstriction.  The pledgets were subsequently removed.  I injected  1% lidocaine with 1:100,000 epinephrine into the inferior turbinates  bilaterally.  Attention was first focused on the right side.  The  inferior one half of the inferior turbinates was clamped with straight  hemostat.  After three minutes of clamping, the hemostat was released.  The inferior one half of the inferior turbinate was then resected with  cross action scissors.  Hemostasis was achieved with suction  electrocautery.  The same procedure was then repeated on the left side  without exception.  The patient tolerated the procedure well without  difficulty.  The care of the patient was turned over to the  anesthesiologist.  The patient was awakened from anesthesia without  difficulty.  She was extubated and transferred to the recovery room in  good condition.   OPERATIVE FINDINGS:  Bilateral inferior turbinate hypertrophy.   SPECIMEN REMOVED:  A  portion of the bilateral inferior turbinate.   FOLLOW UP CARE:  The patient will be observed in the postanesthetic care  unit.  She will be discharged home once she is awake and alert.  She  will be placed on Vicodin 1-2 tablets p.o. q.4-6h. p.r.n. pain, and  Keflex 500 mg p.o. q.i.d. for 5 days.  She will follow-up in my office  in approximately one week.      Newman Pies, MD  Electronically Signed     ST/MEDQ  D:  10/16/2006  T:  10/16/2006  Job:  [id number]

## 2010-06-10 ENCOUNTER — Other Ambulatory Visit: Payer: Self-pay | Admitting: Family Medicine

## 2010-06-10 NOTE — Telephone Encounter (Signed)
Refill request

## 2010-06-15 ENCOUNTER — Emergency Department (HOSPITAL_COMMUNITY): Payer: 59

## 2010-06-15 ENCOUNTER — Encounter: Payer: Self-pay | Admitting: Family Medicine

## 2010-06-15 ENCOUNTER — Inpatient Hospital Stay (HOSPITAL_COMMUNITY)
Admission: EM | Admit: 2010-06-15 | Discharge: 2010-06-19 | DRG: 313 | Disposition: A | Discharge status: Home / Self Care | Payer: 59 | Attending: Family Medicine | Admitting: Family Medicine

## 2010-06-15 DIAGNOSIS — R0789 Other chest pain: Principal | ICD-10-CM | POA: Diagnosis present

## 2010-06-15 DIAGNOSIS — I252 Old myocardial infarction: Secondary | ICD-10-CM

## 2010-06-15 DIAGNOSIS — B9789 Other viral agents as the cause of diseases classified elsewhere: Secondary | ICD-10-CM | POA: Diagnosis present

## 2010-06-15 DIAGNOSIS — G43909 Migraine, unspecified, not intractable, without status migrainosus: Secondary | ICD-10-CM | POA: Diagnosis present

## 2010-06-15 DIAGNOSIS — Z7982 Long term (current) use of aspirin: Secondary | ICD-10-CM

## 2010-06-15 DIAGNOSIS — F319 Bipolar disorder, unspecified: Secondary | ICD-10-CM | POA: Diagnosis present

## 2010-06-15 DIAGNOSIS — R110 Nausea: Secondary | ICD-10-CM | POA: Diagnosis present

## 2010-06-15 DIAGNOSIS — E785 Hyperlipidemia, unspecified: Secondary | ICD-10-CM | POA: Diagnosis present

## 2010-06-15 DIAGNOSIS — R000 Tachycardia, unspecified: Secondary | ICD-10-CM | POA: Diagnosis present

## 2010-06-15 DIAGNOSIS — R51 Headache: Secondary | ICD-10-CM

## 2010-06-15 DIAGNOSIS — R197 Diarrhea, unspecified: Secondary | ICD-10-CM | POA: Diagnosis not present

## 2010-06-15 DIAGNOSIS — E039 Hypothyroidism, unspecified: Secondary | ICD-10-CM | POA: Diagnosis present

## 2010-06-15 DIAGNOSIS — G4733 Obstructive sleep apnea (adult) (pediatric): Secondary | ICD-10-CM | POA: Diagnosis present

## 2010-06-15 DIAGNOSIS — IMO0002 Reserved for concepts with insufficient information to code with codable children: Secondary | ICD-10-CM

## 2010-06-15 LAB — URINALYSIS, ROUTINE W REFLEX MICROSCOPIC
Bilirubin Urine: NEGATIVE
Specific Gravity, Urine: 1.016 (ref 1.005–1.030)
Urobilinogen, UA: 0.2 mg/dL (ref 0.0–1.0)
pH: 8 (ref 5.0–8.0)

## 2010-06-15 LAB — COMPREHENSIVE METABOLIC PANEL
ALT: 19 U/L (ref 0–35)
AST: 21 U/L (ref 0–37)
Albumin: 3.3 g/dL — ABNORMAL LOW (ref 3.5–5.2)
Calcium: 8.3 mg/dL — ABNORMAL LOW (ref 8.4–10.5)
Creatinine, Ser: 0.85 mg/dL (ref 0.4–1.2)
GFR calc Af Amer: >60 mL/min (ref >60)
GFR calc non Af Amer: >60 mL/min (ref >60)
Sodium: 134 mEq/L — ABNORMAL LOW (ref 135–145)
Total Protein: 6.8 g/dL (ref 6.0–8.3)

## 2010-06-15 LAB — DIFFERENTIAL
Basophils Absolute: 0 10*3/uL (ref 0.0–0.1)
Eosinophils Relative: 1 % (ref 0–5)
Lymphocytes Relative: 5 % — ABNORMAL LOW (ref 12–46)
Lymphs Abs: 0.5 10*3/uL — ABNORMAL LOW (ref 0.7–4.0)
Neutro Abs: 8.5 10*3/uL — ABNORMAL HIGH (ref 1.7–7.7)

## 2010-06-15 LAB — CK TOTAL AND CKMB (NOT AT ARMC)
CK, MB: 1.5 ng/mL (ref 0.3–4.0)
CK, MB: 1.5 ng/mL (ref 0.3–4.0)
Relative Index: INVALID (ref 0.0–2.5)
Relative Index: 1.4 (ref 0.0–2.5)
Total CK: 96 U/L (ref 7–177)

## 2010-06-15 LAB — CBC
HCT: 40.4 % (ref 36.0–46.0)
Hemoglobin: 13.5 g/dL (ref 12.0–15.0)
MCV: 95.1 fL (ref 78.0–100.0)
RDW: 13.3 % (ref 11.5–15.5)
WBC: 9.5 10*3/uL (ref 4.0–10.5)

## 2010-06-15 LAB — URINE MICROSCOPIC-ADD ON

## 2010-06-15 LAB — TROPONIN I
Troponin I: <0.3 ng/mL (ref <0.30)
Troponin I: <0.3 ng/mL (ref <0.30)

## 2010-06-15 NOTE — Progress Notes (Unsigned)
  Subjective:    Patient ID: Krista Young, female    DOB: 1965/04/20, 45 y.o.   MRN: [id number]  HPI    Review of Systems     Objective:   Physical Exam        Assessment & Plan:

## 2010-06-16 LAB — CARDIAC PANEL(CRET KIN+CKTOT+MB+TROPI)
CK, MB: 1.2 ng/mL (ref 0.3–4.0)
Total CK: 81 U/L (ref 7–177)

## 2010-06-16 LAB — CK TOTAL AND CKMB (NOT AT ARMC)
CK, MB: 1.3 ng/mL (ref 0.3–4.0)
Relative Index: INVALID (ref 0.0–2.5)
Total CK: 90 U/L (ref 7–177)

## 2010-06-16 LAB — LIPID PANEL
Cholesterol: 163 mg/dL (ref 0–200)
LDL Cholesterol: 91 mg/dL (ref 0–99)
VLDL: 15 mg/dL (ref 0–40)

## 2010-06-16 LAB — CBC
MCH: 32 pg (ref 26.0–34.0)
MCHC: 33.7 g/dL (ref 30.0–36.0)
Platelets: 249 10*3/uL (ref 150–400)
RDW: 13.1 % (ref 11.5–15.5)

## 2010-06-16 LAB — BASIC METABOLIC PANEL
CO2: 25 mEq/L (ref 19–32)
Calcium: 8.1 mg/dL — ABNORMAL LOW (ref 8.4–10.5)
Creatinine, Ser: 0.83 mg/dL (ref 0.4–1.2)
GFR calc Af Amer: >60 mL/min (ref >60)
GFR calc non Af Amer: >60 mL/min (ref >60)

## 2010-06-16 LAB — RAPID URINE DRUG SCREEN, HOSP PERFORMED
Amphetamines: NOT DETECTED
Tetrahydrocannabinol: NOT DETECTED

## 2010-06-16 LAB — HEMOGLOBIN A1C: Mean Plasma Glucose: 111 mg/dL (ref <117)

## 2010-06-17 LAB — CARDIAC PANEL(CRET KIN+CKTOT+MB+TROPI): CK, MB: 1.3 ng/mL (ref 0.3–4.0)

## 2010-06-21 NOTE — H&P (Signed)
NAMEPREESHA, BENJAMIN NO.:  [id number]  MEDICAL RECORD NO.:  [id number]           PATIENT TYPE:  LOCATION:                                 FACILITY:  PHYSICIAN:  Santiago Bumpers. Rosabella Edgin, M.D.     DATE OF BIRTH:  DATE OF ADMISSION: DATE OF DISCHARGE:                             HISTORY & PHYSICAL   PRIMARY CARE PROVIDER:  Angeline Slim, MD, [facility].  CHIEF COMPLAINT:  Chest pain.  HISTORY OF PRESENT ILLNESS:  This is a 45 year old female with history of MI at age 77 secondary to vasospasm presenting with 1-day history of feeling tired.  The evening prior to admission, the patient had a headache, which extended into her back.  The patient states this is similar in nature to the pain that she had with her MI years ago.  The patient was also nauseated.  She took Excedrin Migraine and went to bed last night.  When she woke up this morning she continued having a headache.  She came to work today.  Her headache and back pain persisted.  In addition, this morning the patient felt that her heart was beating hard, felt like a toothache and was achy.  The patient was still having nausea and indigestion.  She also began experiencing sweating.  Clamminess and feeling feverish tonight.  The patient states that she "just does not feel right."  Headache is 8/10, throbbing with sharp back pain down her entire spine as well as chest/heart achiness.  ALLERGIES:  CODEINE.  MEDICATIONS: 1. Lipitor. 2. Depakote. 3. Lamictal. 4. Wellbutrin. 5. Synthroid.  PAST MEDICAL HISTORY: 1. At age 74 secondary to vasospasm, likely Prinzmetal angina. 2. Hypothyroidism. 3. Hyperlipidemia. 4. Bipolar disorder. 5. Obstructive sleep apnea.  PAST SURGICAL HISTORY: 1. Cardiac cath at age 63. 2. Rotator cuff repair in April 2012.  SOCIAL HISTORY:  The patient lives in [place].  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  The patient denies tobacco, alcohol,  or drug use.  FAMILY HISTORY:  Significant for cardiac history including mother with an MI at age 65 requiring 4 stents, an aunt with pacemaker and maternal grandmother who passed away at age 2 from a massive MI.  REVIEW OF SYSTEMS:  Positive for fevers, sweats, fatigue, headache, chest pain, palpitations, nausea, and weakness.  Negative for sore throat, edema, orthopnea, cough, dyspnea, wheezing, sputum production, vomiting, diarrhea, abdominal pain, dysuria, incontinence, rash, or myalgias.  PHYSICAL EXAMINATION:  VITAL SIGNS:  Temperature 99.6, blood pressure 103/55, pulse 117, respiratory rate 20, pO2 96% on room air. GENERAL:  No acute distress. HEENT:  Dry mucous membranes.  Extraocular movements intact.  PERRLA. No cervical lymphadenopathy.  Pharynx clear without erythema or exudate. CARDIOVASCULAR:  Tachycardic.  Regular rhythm.  No murmurs appreciated. LUNGS:  Clear to auscultation bilaterally.  No wheezes or crackles. ABDOMEN:  Soft, nontender, nondistended.  Positive bowel sounds. BACK:  No obvious deformities. GU:  No CVA tenderness. EXTREMITIES:  Warm and well-perfused.  No edema.  2+ pedal pulses. NEURO:  Alert and oriented x3.  No focal deficits.  LABS AND STUDIES:  CBC 9.5/13.5/40.4/255, 89% neutrophils.  CMET 134/4.1/97/27/14/0.85/82.  T. bili 0.3, alk phos 57, AST/ALT 21/19, total protein 6.8, albumin 3.3, calcium 8.3.  Urine pregnancy negative. Urinalysis was 15 ketones, trace blood, small leukocytes, few epithelial cells, rare bacteria, 3-6 WBCs.  Cardiac enzymes negative x2 1.5 hours apart.  Valproic acid 81.1.  Lithium less than 0.25.  Chest x-ray no active lung disease.  ASSESSMENT AND PLAN:  This is a 45 year old female with history of myocardial infarction secondary to vasospasm at age 70 presenting with chest pain, back pain, and tachycardia. 1. Chest pain.  Differential diagnosis includes cardiac versus GI.  We     will likely consult [facility]  in the morning as this is the     patient's primary cardiology team.  She was last seen few months     ago.  She is currently not on any cardiac medications; however,     does have a history of being on Cardizem.  This chest pain could be     a reoccurrence of her Prinzmetal angina, which was likely the cause     of the cardiac damage at age 89 versus a new NSTEMI.  Initial EKG     showed sinus tachycardia without any other abnormalities.  We will     monitor on telemetry overnight and repeat EKG in the morning.  We     will cycle cardiac enzymes for 2 additional sets for a total of     three sets.  First set was negative; however, it is known that if     this chest pain is due to vasospasm, there may not be rise in     cardiac enzymes.  We will give a GI cocktail x1 to help with nausea     and epigastric discomfort and see if this helps in easing the     patient's chest pain.  We will also give nitro paste to the chest     as nitrates can help relieve chest discomfort if this is due to     vasospasm.  In addition, we will check UDS to rule out cocaine-     induced vasospasm.  We will hold the patient's home propranolol as     this is a nonselective beta agonist, it can worsen vasospasm. 2 . Headache/back pain.  This could be continuation of her chest pain     versus migraine versus viral or generalized illness.  We will give him     migraine cocktail and see this improves since the patient has not had     much relief from morphine and Dilaudid given in the emergency     department.  We could consider muscle relaxant as the next step in pain     relief.  We will give IV fluids as the patient does clinically appear to     be dry, this could be a contributing factor to her headache.  The     patient will be getting nitro paste and it is well known that this nitro     paste could worsen the patient's headache.  We will keep this in mind     when deciding further plan of action if headache is  not improved by     migraine cocktail. 3. Tachycardia.  The patient denies taking cardiac meds, but home     medication list includes propranolol; however, we will hold as     noted above.  Tachycardia could be as an adrenergic response  secondary to myocardial damage if vasospasm had been prolonged;     however, this is secondary tachycardia from fever/viral illness     versus response to headache and back pain.  We will monitor give IV     fluids and pain medications and observe for improvement or changes. 4. Hypothyroidism.  We will continue the patient's home Synthroid. 5. Hyperlipidemia.  We will continue the patient's home Lipitor. 6. Bipolar disorder.  We will continue the patient's home Depakote,     Wellbutrin, Lamictal, and gabapentin. 7. Fluid, electrolytes, and nutrition/gastrointestinal.  We will start     healthy diet as tolerated.  We will run normal saline at 100 mL per     hour and give Zofran p.r.n. nausea. 8. Prophylaxis.  Nexium and subcu heparin. 9. Disposition pending clinical improvement.    ______________________________ Demetria Pore, MD   ______________________________ Santiago Bumpers. Leveda Anna, M.D.    JM/MEDQ  D:  06/16/2010  T:  06/16/2010  Job:  [id number]  cc:   Angeline Slim, MD  Electronically Signed by Demetria Pore MD on 06/20/2010 02:55:04 PM Electronically Signed by Doralee Albino M.D. on 06/21/2010 08:11:01 AM

## 2010-06-23 ENCOUNTER — Other Ambulatory Visit: Payer: Self-pay | Admitting: Family Medicine

## 2010-06-23 MED ORDER — PRAVASTATIN SODIUM 80 MG PO TABS: 80.0000 mg | ORAL_TABLET | Freq: Every evening | ORAL | Status: DC

## 2010-06-30 ENCOUNTER — Inpatient Hospital Stay: Payer: 59 | Admitting: Family Medicine

## 2010-06-30 NOTE — Discharge Summary (Signed)
Krista Young, LEAVENS NO.:  [id number]  MEDICAL RECORD NO.:  [id number]           PATIENT TYPE:  E  LOCATION:  [facility]                         FACILITY:  [facility]  PHYSICIAN:  Santiago Bumpers. Hensel, M.D.DATE OF BIRTH:  July 11, 1965  DATE OF ADMISSION:  06/15/2010 DATE OF DISCHARGE:  06/20/2010                              DISCHARGE SUMMARY   PRIMARY CARE PROVIDER:  Dr. Angeline Slim, at [facility].  DISCHARGE DIAGNOSES: 1. Viral illness. 2. Chest pain, resolved. 3. History of Prinzmetal angina. 4. Hyperlipidemia. 5. Hypothyroidism. 6. Bipolar disorder. 7. Obstructive sleep apnea. 8. Question of migraines.  DISCHARGE MEDICATIONS: 1. Ativan 0.5 to 1 mg p.o. q.4 h. p.r.n. 2. Tylenol 650 mg p.o. q.6 h. p.r.n. 3. Norvasc 2.5 mg p.o. daily. 4. Ciprofloxacin 500 mg p.o. b.i.d. 5. Metronidazole 500 mg p.o. t.i.d. 6. Oxycodone 5-10 mg p.o. q.6 h. p.r.n. 7. Compazine 10 mg p.o. q.6 h. p.r.n. 8. Depakote 1500 mg p.o. nightly. 9. Gabapentin 800 mg p.o. t.i.d. 10.Ibuprofen 600 mg p.o. q.i.d. p.r.n. 11.Lamictal 150 mg p.o. daily. 12.Nexium 40 mg p.o. daily. 13.Simvastatin 40 mg p.o. nightly. 14.Synthroid 50 mcg p.o. q.a.m. 15.Wellbutrin 300 mg p.o. q.a.m.  Medications which were stopped during this hospitalization include propranolol 60 mg p.o. daily.  CONSULTS:  Dr. Katrinka Blazing with Cardiology.  PROCEDURES:  None.  LABORATORY DATA:  On admission the patient initially had CBC showing white count of 9.5, hemoglobin of 13.5, platelets of 255, 89% neutrophils, and CMET within normal limits with the exception of a sodium of 134 and albumin of 3.38, calcium of 8.3.  Negative cardiac enzymes x5.  TSH appeared normal at 0.652.  Negative urine pregnancy test.  Fasting lipid panel, cholesterol 163, triglycerides 75, HDL 57, and LDL 91.  Lithium level less 0.25.  Valproic acid level 81.1. Negative urine drug screen.  Urinalysis with small leukocytes, few epithelial cells  and trace blood, 15 ketones.  BRIEF HOSPITAL COURSE:  This is a 45 year old female with history of Prinzmetal angina leading to myocardial ischemia when she was 24, presenting with chest pain, tachycardia, nausea, and headache. 1. Chest pain.  The patient was admitted for chest pain rule out     given her concerning history of MI at age 27 from vasospasm.     Cardiac enzymes were cycled and were negative x3.  Troponin x1 was     taken on the third full day of hospitalization as the patient was     continuing to have chest pain which was also negative.  The     patient's primary cardiologist, Dr. Katrinka Blazing with [facility],     was consulted for their input, it was felt by them that this was     less likely to be cardiac in nature.  They did not feel the need     for an echocardiogram or cath at this time and they will continue     following her for this chest pain issue.  The patient was kept on     telemetry while in-house without any episodes. 2. Headache.  The patient's most serious complaint throughout     hospitalization include  severe headache which went from her     forehead all the way down her spine.  Dilaudid and morphine were     both tried without much success.  The patient was given some     Compazine which seemed to helped with her nausea and her headache.     In addition, she was given some Ativan and dexamethasone as well as     morphine for possible status migrainous.  On the day of discharge,     headache had somewhat improved. 3. Nausea.  The patient remained afebrile without a white count,     however, did develop diarrhea x1 or 2 days and that time Cipro and     Flagyl was started for possible colitis-type picture.  The     patient's diarrhea resolved within 24 hours.  Initially was given     Phenergan and Zofran p.r.n. nausea which switched to Compazine as     this appeared to work better for her. 4. Tachycardia.  The patient initially tachycardic into the 110s  on     admission likely secondary to combination of dehydration and pain.     After IV fluids and pain control, the patient's heart rate returned     to normal.  On the day of discharge, it was in the 80s to 90s. 5. Bipolar disorder.  The patient was continued on her home     medications. 6. Hypothyroidism.  The patient was continued on her home dose of     Synthroid.  TSH was within normal limits. 7. Hyperlipidemia.  The patient was continued on her home Zocor. 8. Obstructive sleep apnea.  The patient was continued on BiPAP     nightly.  DISCHARGE INSTRUCTIONS:  The patient was instructed to increase activity slowly.  She was told not to return to work until June 21, 2010, to continue a low-sodium heart-healthy diet.  FOLLOWUP APPOINTMENTS: 1. Dr. Janalyn Harder at [facility] on Wednesday, June 13, at     10:15. 2. Dr. Katrinka Blazing with [facility].  The patient is to call and set up     an appointment. 3. Dr. Terrace Arabia at [facility].  The patient is to call to set up a     followup appointment with Neurology about her headache.  DISCHARGE CONDITION:  The patient was discharged home in stable medical condition with improvement of clinical symptoms.    ______________________________ Krista Pore, MD   ______________________________ Santiago Bumpers. Krista Young, M.D.    JM/MEDQ  D:  06/20/2010  T:  06/21/2010  Job:  [id number]  cc:   Angeline Slim, MD  Electronically Signed by Krista Pore MD on 06/28/2010 11:35:16 AM Electronically Signed by Doralee Albino M.D. on 06/30/2010 09:26:19 AM

## 2010-07-06 ENCOUNTER — Ambulatory Visit: Payer: 59 | Admitting: Family Medicine

## 2010-07-07 NOTE — Consult Note (Signed)
NAMEKEIGAN, GIRTEN NO.:  [id number]  MEDICAL RECORD NO.:  [id number]           PATIENT TYPE:  E  LOCATION:  [facility]                         FACILITY:  [facility]  PHYSICIAN:  Corky Crafts, MDDATE OF BIRTH:  07-Sep-1965  DATE OF CONSULTATION:  06/16/2010 DATE OF DISCHARGE:                                CONSULTATION   PRIMARY CARDIOLOGIST:  Lyn Records, MD  REFERRING PHYSICIAN:  Santiago Bumpers. Hensel, MD  REASON FOR CONSULTATION:  Chest discomfort.  HISTORY OF PRESENT ILLNESS:  The patient is a 45 year old who has a prior history of vasospasm and she states myocardial infarction at age 69.  She had really done well she said for many years.  She had taken Cardizem for a period of time, but has been off this.  In 2009, she had a stress test showing no ischemia.  She says she had a repeat treadmill test about 6 months ago showing no ischemia.  She had been doing well, but over the past couple of days started feeling poorly.  She describes an aching in her chest.  It feels like what she had at the time of her heart attack.  She also has some nausea and some dry heaving.  We were asked to help with further management.  PAST MEDICAL HISTORY: 1. Vasospasm. 2. Hypothyroidism. 3. Bipolar. 4. Hyperlipidemia. 5. Sleep apnea.  MEDICATIONS AT HOME:  Synthroid, Wellbutrin, and Lamictal, Depakote, and Lipitor.  ALLERGIES:  CODEINE.  SOCIAL HISTORY:  She does not smoke, drink, or use drugs.  She lives in [place].  FAMILY HISTORY:  Significant for coronary artery disease in multiple family members at an early age.  PAST SURGICAL HISTORY:  Prior cardiac cath and rotator cuff repair.  REVIEW OF SYSTEMS:  Significant for the nausea, dry heaves, chest aching.  No bleeding problems.  No focal weakness.  All other systems negative.  PHYSICAL EXAMINATION:  VITAL SIGNS:  Blood pressure 131/90, heart rate of 85. GENERAL:  She is awake and alert. HEAD:   Normocephalic, atraumatic. EYES:  Extraocular movements are intact. NECK:  No JVD. CARDIOVASCULAR:  Regular rate and rhythm, S1 and S2. LUNGS:  Clear to auscultation bilaterally. ABDOMEN:  Soft, nontender. EXTREMITIES:  No edema.  ECG shows normal sinus rhythm with anterior T-wave inversions. Creatinine 0.83.  Troponin negative.  HDL 57, LDL 91.  ASSESSMENT/PLAN:  This is a 45 year old with a vague history of vasospasm and now with chest discomfort.  PLAN: 1. Consider adding aspirin 81 mg given that she has a history of MI. 2. Consider adding Imdur 30 mg daily given her vasospasm.  If she has     headaches in response of the nitroglycerin, consider amlodipine 2.5     mg daily to help prevent vasospasm. 3. If her symptoms persist, consider echocardiogram.  She has had a     recent functional study per her report.  If her symptoms do     persist, she could need a repeat catheterization in the future.  We     will follow.     Corky Crafts, MD     JSV/MEDQ  D:  06/16/2010  T:  06/17/2010  Job:  [id number]  Electronically Signed by Lance Muss MD on 07/07/2010 12:11:18 PM

## 2010-07-27 ENCOUNTER — Other Ambulatory Visit: Payer: Self-pay | Admitting: Family Medicine

## 2010-07-27 NOTE — Telephone Encounter (Signed)
Refill request

## 2010-07-30 ENCOUNTER — Other Ambulatory Visit: Payer: Self-pay | Admitting: Family Medicine

## 2010-07-30 DIAGNOSIS — E039 Hypothyroidism, unspecified: Secondary | ICD-10-CM

## 2010-07-30 NOTE — Telephone Encounter (Signed)
Refill request

## 2010-07-31 NOTE — Telephone Encounter (Signed)
Refilled w/ 2 months worth-- pt will need to see me before any further refills.

## 2010-09-01 ENCOUNTER — Ambulatory Visit: Payer: 59 | Admitting: Family Medicine

## 2010-09-14 ENCOUNTER — Other Ambulatory Visit: Payer: Self-pay | Admitting: Family Medicine

## 2010-09-14 NOTE — Telephone Encounter (Signed)
Refill request

## 2010-09-15 NOTE — Telephone Encounter (Signed)
pt MUST MAKE APPT to be seen. It's been >6 months and I JUST refilled this with the same instructions on 07/27/10 so she should not be out of this medicine yet.

## 2010-09-27 ENCOUNTER — Emergency Department (HOSPITAL_COMMUNITY): Payer: 59

## 2010-09-27 ENCOUNTER — Emergency Department (HOSPITAL_COMMUNITY)
Admission: EM | Admit: 2010-09-27 | Discharge: 2010-09-27 | Disposition: A | Discharge status: Home / Self Care | Payer: 59 | Attending: Emergency Medicine | Admitting: Emergency Medicine

## 2010-09-27 DIAGNOSIS — I252 Old myocardial infarction: Secondary | ICD-10-CM | POA: Insufficient documentation

## 2010-09-27 DIAGNOSIS — M25539 Pain in unspecified wrist: Secondary | ICD-10-CM | POA: Insufficient documentation

## 2010-09-27 DIAGNOSIS — S60229A Contusion of unspecified hand, initial encounter: Secondary | ICD-10-CM | POA: Insufficient documentation

## 2010-09-27 DIAGNOSIS — F319 Bipolar disorder, unspecified: Secondary | ICD-10-CM | POA: Insufficient documentation

## 2010-09-27 DIAGNOSIS — M7989 Other specified soft tissue disorders: Secondary | ICD-10-CM | POA: Insufficient documentation

## 2010-09-27 DIAGNOSIS — M79609 Pain in unspecified limb: Secondary | ICD-10-CM | POA: Insufficient documentation

## 2010-09-27 DIAGNOSIS — W010XXA Fall on same level from slipping, tripping and stumbling without subsequent striking against object, initial encounter: Secondary | ICD-10-CM | POA: Insufficient documentation

## 2010-09-27 DIAGNOSIS — Z79899 Other long term (current) drug therapy: Secondary | ICD-10-CM | POA: Insufficient documentation

## 2010-09-27 DIAGNOSIS — E7800 Pure hypercholesterolemia, unspecified: Secondary | ICD-10-CM | POA: Insufficient documentation

## 2010-09-27 DIAGNOSIS — S6990XA Unspecified injury of unspecified wrist, hand and finger(s), initial encounter: Secondary | ICD-10-CM | POA: Insufficient documentation

## 2010-10-12 LAB — RAPID STREP SCREEN (MED CTR MEBANE ONLY): Streptococcus, Group A Screen (Direct): NEGATIVE

## 2010-10-12 LAB — CBC
HCT: 36.3
MCV: 89.6
RBC: 4.05
WBC: 5.7

## 2010-10-12 LAB — INFLUENZA A+B VIRUS AG-DIRECT(RAPID)

## 2010-10-12 LAB — URINALYSIS, ROUTINE W REFLEX MICROSCOPIC
Glucose, UA: NEGATIVE
Hgb urine dipstick: NEGATIVE
Ketones, ur: 15 — AB
Protein, ur: NEGATIVE

## 2010-10-12 LAB — DIFFERENTIAL
Eosinophils Absolute: 0.1
Eosinophils Relative: 1
Lymphs Abs: 0.9
Monocytes Absolute: 0.8
Monocytes Relative: 13 — ABNORMAL HIGH

## 2010-10-12 LAB — BASIC METABOLIC PANEL
BUN: 10
Chloride: 105
GFR calc Af Amer: >60
Potassium: 3.8

## 2010-10-22 LAB — BASIC METABOLIC PANEL
BUN: 7 mg/dL (ref 6–23)
CO2: 25 mEq/L (ref 19–32)
Calcium: 9.4 mg/dL (ref 8.4–10.5)
Chloride: 108 mEq/L (ref 96–112)
Creatinine, Ser: 0.86 mg/dL (ref 0.4–1.2)
GFR calc Af Amer: >60 mL/min (ref >60)
Glucose, Bld: 95 mg/dL (ref 70–99)

## 2010-10-22 LAB — DIFFERENTIAL
Basophils Absolute: 0.1 10*3/uL (ref 0.0–0.1)
Basophils Relative: 1 % (ref 0–1)
Eosinophils Absolute: 0.4 10*3/uL (ref 0.0–0.7)
Monocytes Relative: 8 % (ref 3–12)
Neutro Abs: 4.4 10*3/uL (ref 1.7–7.7)
Neutrophils Relative %: 59 % (ref 43–77)

## 2010-10-22 LAB — CBC
MCHC: 32.6 g/dL (ref 30.0–36.0)
MCV: 93.6 fL (ref 78.0–100.0)
Platelets: 345 10*3/uL (ref 150–400)
RBC: 4.35 MIL/uL (ref 3.87–5.11)
RDW: 13.7 % (ref 11.5–15.5)

## 2010-10-22 LAB — POCT CARDIAC MARKERS
CKMB, poc: <1 ng/mL — ABNORMAL LOW (ref 1.0–8.0)
Myoglobin, poc: 42.1 ng/mL (ref 12–200)

## 2010-10-28 LAB — BASIC METABOLIC PANEL
BUN: 11
CO2: 24
Calcium: 8.9
Glucose, Bld: 110 — ABNORMAL HIGH
Potassium: 3.5
Sodium: 134 — ABNORMAL LOW

## 2010-11-02 ENCOUNTER — Other Ambulatory Visit: Payer: Self-pay | Admitting: Family Medicine

## 2010-12-01 ENCOUNTER — Emergency Department (HOSPITAL_COMMUNITY)
Admission: EM | Admit: 2010-12-01 | Discharge: 2010-12-02 | Disposition: A | Discharge status: Home / Self Care | Payer: 59 | Attending: Emergency Medicine | Admitting: Emergency Medicine

## 2010-12-01 ENCOUNTER — Encounter (HOSPITAL_COMMUNITY): Payer: Self-pay

## 2010-12-01 DIAGNOSIS — E039 Hypothyroidism, unspecified: Secondary | ICD-10-CM | POA: Insufficient documentation

## 2010-12-01 DIAGNOSIS — M7989 Other specified soft tissue disorders: Secondary | ICD-10-CM | POA: Insufficient documentation

## 2010-12-01 DIAGNOSIS — R0789 Other chest pain: Secondary | ICD-10-CM | POA: Insufficient documentation

## 2010-12-01 DIAGNOSIS — R51 Headache: Secondary | ICD-10-CM | POA: Insufficient documentation

## 2010-12-01 DIAGNOSIS — M79609 Pain in unspecified limb: Secondary | ICD-10-CM | POA: Insufficient documentation

## 2010-12-01 DIAGNOSIS — F319 Bipolar disorder, unspecified: Secondary | ICD-10-CM | POA: Insufficient documentation

## 2010-12-01 DIAGNOSIS — M79669 Pain in unspecified lower leg: Secondary | ICD-10-CM

## 2010-12-01 DIAGNOSIS — Z79899 Other long term (current) drug therapy: Secondary | ICD-10-CM | POA: Insufficient documentation

## 2010-12-01 DIAGNOSIS — R609 Edema, unspecified: Secondary | ICD-10-CM | POA: Insufficient documentation

## 2010-12-01 DIAGNOSIS — R059 Cough, unspecified: Secondary | ICD-10-CM | POA: Insufficient documentation

## 2010-12-01 DIAGNOSIS — R05 Cough: Secondary | ICD-10-CM | POA: Insufficient documentation

## 2010-12-01 DIAGNOSIS — G4733 Obstructive sleep apnea (adult) (pediatric): Secondary | ICD-10-CM | POA: Insufficient documentation

## 2010-12-01 DIAGNOSIS — F341 Dysthymic disorder: Secondary | ICD-10-CM | POA: Insufficient documentation

## 2010-12-01 DIAGNOSIS — R0602 Shortness of breath: Secondary | ICD-10-CM | POA: Insufficient documentation

## 2010-12-01 DIAGNOSIS — E785 Hyperlipidemia, unspecified: Secondary | ICD-10-CM | POA: Insufficient documentation

## 2010-12-02 ENCOUNTER — Emergency Department (HOSPITAL_COMMUNITY): Payer: 59

## 2010-12-02 ENCOUNTER — Encounter (HOSPITAL_COMMUNITY): Payer: Self-pay | Admitting: Radiology

## 2010-12-02 LAB — URINALYSIS, ROUTINE W REFLEX MICROSCOPIC
Nitrite: NEGATIVE
Specific Gravity, Urine: 1.02 (ref 1.005–1.030)
pH: 7 (ref 5.0–8.0)

## 2010-12-02 LAB — CBC
HCT: 37.5 % (ref 36.0–46.0)
MCH: 31.5 pg (ref 26.0–34.0)
MCV: 93.8 fL (ref 78.0–100.0)
RBC: 4 MIL/uL (ref 3.87–5.11)
WBC: 9.3 10*3/uL (ref 4.0–10.5)

## 2010-12-02 LAB — URINE MICROSCOPIC-ADD ON

## 2010-12-02 LAB — DIFFERENTIAL
Eosinophils Absolute: 0.3 10*3/uL (ref 0.0–0.7)
Eosinophils Relative: 3 % (ref 0–5)
Lymphocytes Relative: 32 % (ref 12–46)
Lymphs Abs: 3 10*3/uL (ref 0.7–4.0)
Monocytes Absolute: 1.2 10*3/uL — ABNORMAL HIGH (ref 0.1–1.0)

## 2010-12-02 LAB — PRO B NATRIURETIC PEPTIDE: Pro B Natriuretic peptide (BNP): 18.4 pg/mL (ref 0–125)

## 2010-12-02 LAB — POCT I-STAT, CHEM 8
Calcium, Ion: 1.11 mmol/L — ABNORMAL LOW (ref 1.12–1.32)
Chloride: 107 mEq/L (ref 96–112)
HCT: 38 % (ref 36.0–46.0)
Potassium: 4 mEq/L (ref 3.5–5.1)
Sodium: 139 mEq/L (ref 135–145)

## 2010-12-02 LAB — CARDIAC PANEL(CRET KIN+CKTOT+MB+TROPI): Relative Index: INVALID (ref 0.0–2.5)

## 2010-12-02 MED ORDER — IBUPROFEN 800 MG PO TABS
800.0000 mg | ORAL_TABLET | Freq: Once | ORAL | Status: AC
  Administered 2010-12-02: 800 mg via ORAL
  Filled 2010-12-02: qty 1

## 2010-12-02 MED ORDER — DIAZEPAM 5 MG PO TABS
ORAL_TABLET | ORAL | Status: AC
  Administered 2010-12-02: 5 mg via ORAL
  Filled 2010-12-02: qty 1

## 2010-12-02 MED ORDER — IOHEXOL 350 MG/ML SOLN
100.0000 mL | Freq: Once | INTRAVENOUS | Status: AC | PRN
  Administered 2010-12-02: 100 mL via INTRAVENOUS

## 2010-12-02 MED ORDER — ENOXAPARIN SODIUM 100 MG/ML ~~LOC~~ SOLN
100.0000 mg | SUBCUTANEOUS | Status: AC
  Administered 2010-12-02: 100 mg via SUBCUTANEOUS
  Filled 2010-12-02: qty 1

## 2010-12-02 MED ORDER — DIAZEPAM 5 MG PO TABS
5.0000 mg | ORAL_TABLET | Freq: Once | ORAL | Status: AC
Start: 2010-12-02 — End: 2010-12-02
  Administered 2010-12-02: 5 mg via ORAL

## 2010-12-02 NOTE — ED Notes (Signed)
No respiratory or acute distress noted resting in bed call light in reach alert and oriented x 3 complaint of headache voiced.

## 2010-12-02 NOTE — ED Notes (Signed)
Still complains of headache notified Scarlette Calico PA awaiting new orders no respiratory or acute distress noted alert and oriented x 3 call light in reach.

## 2010-12-02 NOTE — ED Provider Notes (Signed)
History     CSN: [id number] Arrival date & time: 12/01/2010 11:09 PM   First MD Initiated Contact with Patient 12/02/10 0031      Chief Complaint  Patient presents with  . Leg Swelling    hand swelling also with cramping in legs.    (Consider location/radiation/quality/duration/timing/severity/associated sxs/prior treatment) HPI Comments: Patient here with a month history of worsening shortness of breath, right calf pain, bilateral lower extrememty edema, chest tightness, and headache.  She reports she has not seen her PCP for this.  States worse at the end of the day, reports no history of DVT or PE  Patient is a 45 y.o. female presenting with shortness of breath. The history is provided by the patient. No language interpreter was used.  Shortness of Breath  The current episode started more than 2 weeks ago. The onset was gradual. The problem occurs frequently. The problem has been gradually worsening. The problem is moderate. The symptoms are relieved by rest. The symptoms are aggravated by activity. Associated symptoms include chest pressure, cough and shortness of breath. Pertinent negatives include no chest pain, no orthopnea, no fever, no sore throat, no stridor and no wheezing. There was no intake of a foreign body. She was not exposed to toxic fumes. She has not inhaled smoke recently. She has had no prior steroid use. She has had no prior hospitalizations. She has had no prior ICU admissions. She has had no prior intubations. Her past medical history does not include asthma. She has been behaving normally. Urine output has been normal. There were no sick contacts. She has received no recent medical care.    Past Medical History  Diagnosis Date  . Anxiety   . Depression   . Bipolar 1 disorder   . Hypothyroidism   . Hyperlipidemia   . Obstructive sleep apnea   . History of alcoholism     Past Surgical History  Procedure Date  . Nasal sinus surgery     Family History    Problem Relation Age of Onset  . Alcohol abuse Mother   . Hypertension Mother   . Hyperlipidemia Mother   . Stroke Mother   . Heart disease Mother   . Alcohol abuse Father   . Heart disease Maternal Aunt   . Cancer Maternal Uncle   . Heart disease Maternal Grandmother     History  Substance Use Topics  . Smoking status: Never Smoker   . Smokeless tobacco: Not on file  . Alcohol Use: Yes    OB History    Grav Para Term Preterm Abortions TAB SAB Ect Mult Living                  Review of Systems  Constitutional: Negative.  Negative for fever and chills.  HENT: Negative for ear pain, sore throat and neck pain.   Eyes: Negative.   Respiratory: Positive for cough, chest tightness and shortness of breath. Negative for wheezing and stridor.   Cardiovascular: Positive for leg swelling. Negative for chest pain, palpitations and orthopnea.  Gastrointestinal: Negative.   Genitourinary: Negative.   Musculoskeletal: Negative.   Skin: Negative.   Neurological: Positive for headaches.  Hematological: Negative.   Psychiatric/Behavioral: Negative.     Allergies  Codeine  Home Medications   Current Outpatient Rx  Name Route Sig Dispense Refill  . DIVALPROEX SODIUM 500 MG PO TBEC  Take 3 tabs by mouth at bedtime.    Marland Kitchen ESOMEPRAZOLE MAGNESIUM 40 MG PO CPDR Oral  Take 40 mg by mouth daily. For reflux     . IBUPROFEN 600 MG PO TABS Oral Take 600 mg by mouth 4 (four) times daily. For headache     . LAMOTRIGINE 100 MG PO TABS Oral Take 150 mg by mouth at bedtime.     Marland Kitchen LEVOTHYROXINE SODIUM 50 MCG PO TABS  TAKE 1 TABLET BY MOUTH DAILY (NEW DOSE) 30 tablet 1    Please have pt call and make appt with Dr. Fara Boros. ...  . LORAZEPAM 0.5 MG PO TABS       . PRAVASTATIN SODIUM 80 MG PO TABS Oral Take 1 tablet (80 mg total) by mouth every evening. 30 tablet 1  . QUETIAPINE FUMARATE 200 MG PO TB24 Oral Take 200 mg by mouth at bedtime.        BP 96/62  Pulse 91  Temp(Src) 97.9 F (36.6 C)  (Oral)  Resp 18  SpO2 96%  Physical Exam  Nursing note and vitals reviewed. Constitutional: She is oriented to person, place, and time. She appears well-developed and well-nourished.  HENT:  Head: Normocephalic and atraumatic.  Mouth/Throat: Oropharynx is clear and moist.  Eyes: Conjunctivae are normal. Pupils are equal, round, and reactive to light.  Neck: Normal range of motion. Neck supple. No JVD present.  Cardiovascular: Normal rate, regular rhythm, normal heart sounds and intact distal pulses.  Exam reveals no gallop and no friction rub.   No murmur heard. Pulmonary/Chest: Effort normal and breath sounds normal. No respiratory distress.  Abdominal: Soft. Bowel sounds are normal. There is no tenderness.  Musculoskeletal: She exhibits edema.       Bilateral 1+ pitting edema to both ankles  Neurological: She is alert and oriented to person, place, and time.  Skin: Skin is warm and dry.  Psychiatric: She has a normal mood and affect. Her behavior is normal. Judgment and thought content normal.    ED Course  Procedures (including critical care time)  Labs Reviewed  DIFFERENTIAL - Abnormal; Notable for the following:    Monocytes Relative 13 (*)    Monocytes Absolute 1.2 (*)    All other components within normal limits  URINALYSIS, ROUTINE W REFLEX MICROSCOPIC - Abnormal; Notable for the following:    Appearance CLOUDY (*)    Leukocytes, UA SMALL (*)    All other components within normal limits  POCT I-STAT, CHEM 8 - Abnormal; Notable for the following:    Calcium, Ion 1.11 (*)    All other components within normal limits  URINE MICROSCOPIC-ADD ON - Abnormal; Notable for the following:    Squamous Epithelial / LPF FEW (*)    Bacteria, UA FEW (*)    All other components within normal limits  CBC  CARDIAC PANEL(CRET KIN+CKTOT+MB+TROPI)  PRO B NATRIURETIC PEPTIDE  I-STAT, CHEM 8   Dg Chest 2 View  12/02/2010  *RADIOLOGY REPORT*  Clinical Data: 44 year old female with  shortness of breath, chest pressure, leg swelling.  CHEST - 2 VIEW  Comparison: 06/15/2010 and earlier.  Findings: Lung volumes are within normal limits.  Cardiac size and mediastinal contours are within normal limits.  Visualized tracheal air column is within normal limits.  No pneumothorax, pulmonary edema, pleural effusion or confluent pulmonary opacity. No acute osseous abnormality identified.  IMPRESSION: No acute cardiopulmonary abnormality.  Original Report Authenticated By: Harley Hallmark, M.D.   Ct Angio Chest W/cm &/or Wo Cm  12/02/2010  *RADIOLOGY REPORT*  Clinical Data:  45 year old female with chest tightness, shortness of breath,  right lower extremity pain and swelling.  CT ANGIOGRAPHY CHEST WITH CONTRAST  Technique:  Multidetector CT imaging of the chest was performed using the standard protocol during bolus administration of intravenous contrast.  Multiplanar CT image reconstructions including MIPs were obtained to evaluate the vascular anatomy.  Contrast: OMNIPAQUE IOHEXOL 350 MG/ML IV SOLN  Comparison:  Chest radiograph from the same day and earlier.  Findings:  Suboptimal contrast bolus timing in the pulmonary arterial tree.  Intermittent respiratory motion artifact noted in the lower lobes.  No central or lumbar pulmonary artery filling defect.  Segmental and distal branches are not well evaluated.  Azygos fissure.  Major airways are patent except for atelectatic changes.  Respiratory motion artifact at the lung bases with dependent ground-glass opacity likely due to atelectasis.  No consolidation or other confluent pulmonary opacity.  No pericardial or pleural effusion.  No mediastinal lymphadenopathy.  Negative thoracic inlet.  Negative visualized upper abdominal viscera.  No acute osseous abnormality identified.  Occasional thoracic disc degeneration.  Review of the MIP images confirms the above findings.  IMPRESSION: 1.  Suboptimal contrast bolus timing compounded by intermittent  respiratory motion in the lower lobe.  No central or lumbar pulmonary embolism.  Segmental and distal branches are not well evaluated.  Consider VQ scan if high clinical suspicion persists. 2.  Atelectasis.  No other acute pulmonary findings.  Original Report Authenticated By: Harley Hallmark, M.D.     RLE edema   MDM  All other testing negative for PE, CHF.  Suspect this may be more anxiety driven than anything but will start patient on lovenox and get outpatient vascular study tomorrow.  She will then follow up with PCP Dr. Lovell Sheehan.        Izola Price [place], [place] 12/02/10 0330

## 2010-12-02 NOTE — ED Provider Notes (Signed)
Medical screening examination/treatment/procedure(s) were performed by non-physician practitioner and as supervising physician I was immediately available for consultation/collaboration.   Merle Cirelli L Jocelynne Duquette, MD 12/02/10 0626 

## 2010-12-02 NOTE — ED Notes (Signed)
ASSUMED CARE ON PT. NO PAIN OR DISCOMFORT, RESPIRATIONS UNLABORED, BEDRAILS UP , CALL LIGHT WITHIN REACH.

## 2010-12-21 ENCOUNTER — Other Ambulatory Visit: Payer: Self-pay | Admitting: Family Medicine

## 2011-02-08 ENCOUNTER — Other Ambulatory Visit: Payer: Self-pay | Admitting: Family Medicine

## 2011-08-03 ENCOUNTER — Encounter (HOSPITAL_COMMUNITY): Payer: Self-pay | Admitting: Emergency Medicine

## 2011-08-03 ENCOUNTER — Emergency Department (INDEPENDENT_AMBULATORY_CARE_PROVIDER_SITE_OTHER)
Admission: EM | Admit: 2011-08-03 | Discharge: 2011-08-03 | Disposition: A | Discharge status: Home / Self Care | Payer: Self-pay | Source: Home / Self Care | Attending: Emergency Medicine | Admitting: Emergency Medicine

## 2011-08-03 ENCOUNTER — Emergency Department (INDEPENDENT_AMBULATORY_CARE_PROVIDER_SITE_OTHER): Payer: Self-pay

## 2011-08-03 DIAGNOSIS — K529 Noninfective gastroenteritis and colitis, unspecified: Secondary | ICD-10-CM

## 2011-08-03 DIAGNOSIS — K5289 Other specified noninfective gastroenteritis and colitis: Secondary | ICD-10-CM

## 2011-08-03 DIAGNOSIS — N390 Urinary tract infection, site not specified: Secondary | ICD-10-CM

## 2011-08-03 HISTORY — DX: Acute myocardial infarction, unspecified: I21.9

## 2011-08-03 HISTORY — DX: Endometriosis, unspecified: N80.9

## 2011-08-03 LAB — POCT URINALYSIS DIP (DEVICE)
Glucose, UA: 100 mg/dL — AB
Specific Gravity, Urine: 1.02 (ref 1.005–1.030)
Urobilinogen, UA: 1 mg/dL (ref 0.0–1.0)
pH: 5.5 (ref 5.0–8.0)

## 2011-08-03 MED ORDER — ACIDOPHILUS PROBIOTIC BLEND PO CAPS: 2.0000 | ORAL_CAPSULE | Freq: Two times a day (BID) | ORAL | Status: DC

## 2011-08-03 MED ORDER — GI COCKTAIL ~~LOC~~
ORAL | Status: AC
  Filled 2011-08-03: qty 30

## 2011-08-03 MED ORDER — GI COCKTAIL ~~LOC~~
30.0000 mL | Freq: Once | ORAL | Status: AC
  Administered 2011-08-03: 30 mL via ORAL

## 2011-08-03 MED ORDER — NITROFURANTOIN MONOHYD MACRO 100 MG PO CAPS: 100.0000 mg | ORAL_CAPSULE | Freq: Two times a day (BID) | ORAL | Status: AC

## 2011-08-03 MED ORDER — DICYCLOMINE HCL 20 MG PO TABS: 20.0000 mg | ORAL_TABLET | Freq: Four times a day (QID) | ORAL | Status: DC | PRN

## 2011-08-03 NOTE — ED Provider Notes (Signed)
History     CSN: [id number]  Arrival date & time 08/03/11  1538   First MD Initiated Contact with Patient 08/03/11 1740      Chief Complaint  Patient presents with  . Abdominal Pain    (Consider location/radiation/quality/duration/timing/severity/associated sxs/prior treatment) HPI Comments: Patient states that she ate a bowl of vegetable soup soup 2 days ago, and felt "bad" shortly thereafter. States she vomited this up, but continued to have several episodes of nonbilious, nonbloody emesis followed by watery, nonbloody diarrhea. States this is largely resolved, but no complains of sharp, diffuse, abdominal pain lasting seconds and resolving  Starting last night.  States that she had a normal bowel movement this morning. Abdominal pain is not affected with eating, fasting, bowel movement, urination. No aggravating or alleviating factors. She does report some anorexia and cloudy urine  No fevers, oderous urine, urinary urgency, frequency, hematuria, back pain. No abdominal distention. No vaginal bleeding or discharge. She is in a monogamous relationship with a female partner, who is asymptomatic. STDs are not a concern today. She denies any recent travel, raw/undercooked foods, questionable leftovers, or antibiotics in the past month. She works in the ED, has multiple sick contacts. She has a history of laparoscopic abdominal surgery for endometriosis, but no other abdominal surgeries. She is not a diabetic.  ROS as noted in HPI. All other ROS negative.   Patient is a 46 y.o. female presenting with abdominal pain. The history is provided by the patient. No language interpreter was used.  Abdominal Pain The primary symptoms of the illness include abdominal pain. The current episode started yesterday.  The abdominal pain is generalized. The abdominal pain does not radiate. The abdominal pain is relieved by nothing.  The patient states that she believes she is currently not pregnant. The patient  has not had a change in bowel habit. Additional symptoms associated with the illness include anorexia. Symptoms associated with the illness do not include chills, heartburn, constipation, urgency, hematuria, frequency or back pain. Significant associated medical issues do not include diabetes, gallstones, liver disease or diverticulitis.    Past Medical History  Diagnosis Date  . Anxiety   . Depression   . Bipolar 1 disorder   . Hypothyroidism   . Hyperlipidemia   . Obstructive sleep apnea   . History of alcoholism   . Myocardial infarction   . Endometriosis     Past Surgical History  Procedure Date  . Nasal sinus surgery   . Abdominal surgery     laproscopic sx for endometriosis    Family History  Problem Relation Age of Onset  . Alcohol abuse Mother   . Hypertension Mother   . Hyperlipidemia Mother   . Stroke Mother   . Heart disease Mother   . Alcohol abuse Father   . Heart disease Maternal Aunt   . Cancer Maternal Uncle   . Heart disease Maternal Grandmother     History  Substance Use Topics  . Smoking status: Never Smoker   . Smokeless tobacco: Not on file  . Alcohol Use: No    OB History    Grav Para Term Preterm Abortions TAB SAB Ect Mult Living                  Review of Systems  Constitutional: Negative for chills.  Gastrointestinal: Positive for abdominal pain and anorexia. Negative for heartburn and constipation.  Genitourinary: Negative for urgency, frequency and hematuria.  Musculoskeletal: Negative for back pain.  Allergies  Codeine  Home Medications   Current Outpatient Rx  Name Route Sig Dispense Refill  . IBUPROFEN 600 MG PO TABS Oral Take 600 mg by mouth 4 (four) times daily. For headache     . DICYCLOMINE HCL 20 MG PO TABS Oral Take 1 tablet (20 mg total) by mouth every 6 (six) hours as needed. 12 tablet 0  . DIVALPROEX SODIUM 500 MG PO TBEC  Take 3 tabs by mouth at bedtime.    Marland Kitchen ESOMEPRAZOLE MAGNESIUM 40 MG PO CPDR Oral Take 40  mg by mouth daily. For reflux     . LAMOTRIGINE 100 MG PO TABS Oral Take 150 mg by mouth at bedtime.     Marland Kitchen LEVOTHYROXINE SODIUM 50 MCG PO TABS  TAKE 1 TABLET BY MOUTH DAILY (NEW DOSE) 30 tablet 1    Please have pt call and make appt with Dr. Fara Boros. ...  . LORAZEPAM 0.5 MG PO TABS       . NITROFURANTOIN MONOHYD MACRO 100 MG PO CAPS Oral Take 1 capsule (100 mg total) by mouth 2 (two) times daily. 10 capsule 0  . PRAVASTATIN SODIUM 80 MG PO TABS Oral Take 1 tablet (80 mg total) by mouth every evening. 30 tablet 1  . ACIDOPHILUS PROBIOTIC BLEND PO CAPS Oral Take 2 capsules by mouth 2 (two) times daily. 60 capsule 0  . QUETIAPINE FUMARATE ER 200 MG PO TB24 Oral Take 200 mg by mouth at bedtime.        BP 135/86  Pulse 109  Temp 98.4 F (36.9 C) (Oral)  Resp 20  SpO2 99%  LMP 07/06/2011  Physical Exam  Nursing note and vitals reviewed. Constitutional: She is oriented to person, place, and time. She appears well-developed and well-nourished. No distress.  HENT:  Head: Normocephalic and atraumatic.  Eyes: Conjunctivae and EOM are normal.  Neck: Normal range of motion.  Cardiovascular: Normal rate.   Pulmonary/Chest: Effort normal.  Abdominal: Soft. Normal appearance and bowel sounds are normal. There is generalized tenderness. There is no rigidity, no rebound, no guarding, no CVA tenderness, no tenderness at McBurney's point and negative Murphy's sign.       Healed laparoscopic scar at umbilicus. Mild tympany throughout.   Musculoskeletal: Normal range of motion.  Neurological: She is alert and oriented to person, place, and time. Coordination normal.  Skin: Skin is warm and dry.  Psychiatric: She has a normal mood and affect. Her behavior is normal. Judgment and thought content normal.    ED Course  Procedures (including critical care time)  Labs Reviewed  POCT URINALYSIS DIP (DEVICE) - Abnormal; Notable for the following:    Glucose, UA 100 (*)     Bilirubin Urine MODERATE (*)       Ketones, ur TRACE (*)     Hgb urine dipstick TRACE (*)     Protein, ur 30 (*)     Leukocytes, UA SMALL (*)  Biochemical Testing Only. Please order routine urinalysis from main lab if confirmatory testing is needed.   All other components within normal limits  GLUCOSE, CAPILLARY - Abnormal; Notable for the following:    Glucose-Capillary 117 (*)     All other components within normal limits  URINE CULTURE   Dg Abd Acute W/chest  08/03/2011  *RADIOLOGY REPORT*  Clinical Data: Abdominal pain and distention, sudden onset  ACUTE ABDOMEN SERIES (ABDOMEN 2 VIEW & CHEST 1 VIEW)  Comparison: None.  Findings: The lungs are clear.  Mediastinal contours appear normal.  The heart is within normal limits in size.  Supine and erect views of the abdomen show no bowel obstruction. No free air is seen.  No opaque calculi are noted.  IMPRESSION:  1.  No active lung disease. 2.  No bowel obstruction.  No free air.  Original Report Authenticated By: Juline Patch, M.D.     1. UTI (lower urinary tract infection)   2. Gastroenteritis     Results for orders placed during the hospital encounter of 08/03/11  POCT URINALYSIS DIP (DEVICE)      Component Value Range   Glucose, UA 100 (*) NEGATIVE mg/dL   Bilirubin Urine MODERATE (*) NEGATIVE   Ketones, ur TRACE (*) NEGATIVE mg/dL   Specific Gravity, Urine 1.020  1.005 - 1.030   Hgb urine dipstick TRACE (*) NEGATIVE   pH 5.5  5.0 - 8.0   Protein, ur 30 (*) NEGATIVE mg/dL   Urobilinogen, UA 1.0  0.0 - 1.0 mg/dL   Nitrite NEGATIVE  NEGATIVE   Leukocytes, UA SMALL (*) NEGATIVE  GLUCOSE, CAPILLARY      Component Value Range   Glucose-Capillary 117 (*) 70 - 99 mg/dL     MDM  X-ray, UA reviewed by myself. No obstruction. UA noted. Fingerstick 117. Will send her urine off for culture, will start treating her with Macrobid for UTI.  Abdomen is benign. No evidence perforation. Doubt cholecystitis, pancreatitis, diverticulitis, appendicitis, GYN cause of her  symptoms. H&P most consistent with gastroenteritis. She is nontoxic, tolerating by mouth,  mildly tachycardic. Gave GI cocktail, with resolution in abdominal cramping.   Discussed MDM, labs, plan with the patient. Discussed signs and symptoms that should prompt her return to the department. Sent home with Bentyl, Increase fluids, she'll follow with her primary care physician as needed. Patient agrees with plan.  Luiz Blare, MD 08/03/11 2223

## 2011-08-03 NOTE — ED Notes (Signed)
Generalized abdominal pain, nausae and vomiting, diarrhea.  No episodes of diarrhea or vomiting today.  Continues to have abdominal pain. Pain is intermittent, but when present, it is significant pain.  Denies fever.  Denies urinary symptoms

## 2011-08-03 NOTE — ED Notes (Signed)
Sent patient to bathroom with labeled specimen cup.

## 2011-08-04 LAB — URINE CULTURE

## 2015-01-18 HISTORY — PX: HAND SURGERY: SHX662

## 2016-05-28 ENCOUNTER — Encounter (HOSPITAL_COMMUNITY): Payer: Self-pay | Admitting: *Deleted

## 2016-05-28 ENCOUNTER — Emergency Department (HOSPITAL_COMMUNITY)
Admission: EM | Admit: 2016-05-28 | Discharge: 2016-05-28 | Disposition: A | Discharge status: Home / Self Care | Payer: BLUE CROSS/BLUE SHIELD | Attending: Emergency Medicine | Admitting: Emergency Medicine

## 2016-05-28 ENCOUNTER — Emergency Department (HOSPITAL_COMMUNITY): Payer: BLUE CROSS/BLUE SHIELD

## 2016-05-28 DIAGNOSIS — Z91040 Latex allergy status: Secondary | ICD-10-CM | POA: Diagnosis not present

## 2016-05-28 DIAGNOSIS — S0990XA Unspecified injury of head, initial encounter: Secondary | ICD-10-CM | POA: Diagnosis present

## 2016-05-28 DIAGNOSIS — W06XXXA Fall from bed, initial encounter: Secondary | ICD-10-CM | POA: Diagnosis not present

## 2016-05-28 DIAGNOSIS — R51 Headache: Secondary | ICD-10-CM | POA: Diagnosis not present

## 2016-05-28 DIAGNOSIS — Y939 Activity, unspecified: Secondary | ICD-10-CM | POA: Insufficient documentation

## 2016-05-28 DIAGNOSIS — I252 Old myocardial infarction: Secondary | ICD-10-CM | POA: Insufficient documentation

## 2016-05-28 DIAGNOSIS — R519 Headache, unspecified: Secondary | ICD-10-CM

## 2016-05-28 DIAGNOSIS — Y929 Unspecified place or not applicable: Secondary | ICD-10-CM | POA: Diagnosis not present

## 2016-05-28 DIAGNOSIS — Z79899 Other long term (current) drug therapy: Secondary | ICD-10-CM | POA: Diagnosis not present

## 2016-05-28 DIAGNOSIS — S0083XA Contusion of other part of head, initial encounter: Secondary | ICD-10-CM | POA: Insufficient documentation

## 2016-05-28 DIAGNOSIS — R569 Unspecified convulsions: Secondary | ICD-10-CM | POA: Diagnosis not present

## 2016-05-28 DIAGNOSIS — Y999 Unspecified external cause status: Secondary | ICD-10-CM | POA: Insufficient documentation

## 2016-05-28 DIAGNOSIS — E039 Hypothyroidism, unspecified: Secondary | ICD-10-CM | POA: Insufficient documentation

## 2016-05-28 DIAGNOSIS — R110 Nausea: Secondary | ICD-10-CM | POA: Diagnosis not present

## 2016-05-28 DIAGNOSIS — S00531A Contusion of lip, initial encounter: Secondary | ICD-10-CM | POA: Diagnosis not present

## 2016-05-28 DIAGNOSIS — Z7982 Long term (current) use of aspirin: Secondary | ICD-10-CM | POA: Diagnosis not present

## 2016-05-28 HISTORY — DX: Unspecified convulsions: R56.9

## 2016-05-28 LAB — I-STAT CHEM 8, ED
BUN: 10 mg/dL (ref 6–20)
Calcium, Ion: 1.07 mmol/L — ABNORMAL LOW (ref 1.15–1.40)
Chloride: 109 mmol/L (ref 101–111)
Creatinine, Ser: 0.7 mg/dL (ref 0.44–1.00)
Glucose, Bld: 95 mg/dL (ref 65–99)
HEMATOCRIT: 42 % (ref 36.0–46.0)
HEMOGLOBIN: 14.3 g/dL (ref 12.0–15.0)
POTASSIUM: 3.5 mmol/L (ref 3.5–5.1)
Sodium: 138 mmol/L (ref 135–145)
TCO2: 24 mmol/L (ref 0–100)

## 2016-05-28 LAB — CBG MONITORING, ED: GLUCOSE-CAPILLARY: 94 mg/dL (ref 65–99)

## 2016-05-28 MED ORDER — PROCHLORPERAZINE EDISYLATE 5 MG/ML IJ SOLN
10.0000 mg | Freq: Once | INTRAMUSCULAR | Status: AC
  Administered 2016-05-28: 10 mg via INTRAVENOUS
  Filled 2016-05-28: qty 2

## 2016-05-28 MED ORDER — ONDANSETRON 4 MG PO TBDP
4.0000 mg | ORAL_TABLET | Freq: Once | ORAL | Status: DC
  Filled 2016-05-28: qty 1

## 2016-05-28 MED ORDER — DIPHENHYDRAMINE HCL 50 MG/ML IJ SOLN
25.0000 mg | Freq: Once | INTRAMUSCULAR | Status: AC
Start: 2016-05-28 — End: 2016-05-28
  Administered 2016-05-28: 25 mg via INTRAVENOUS
  Filled 2016-05-28: qty 1

## 2016-05-28 MED ORDER — SODIUM CHLORIDE 0.9 % IV BOLUS (SEPSIS)
1000.0000 mL | Freq: Once | INTRAVENOUS | Status: AC
  Administered 2016-05-28: 1000 mL via INTRAVENOUS

## 2016-05-28 NOTE — ED Provider Notes (Signed)
[facility]-EMERGENCY DEPT Provider Note   CSN: [id number] Arrival date & time: 05/28/16  1415     History   Chief Complaint Chief Complaint  Patient presents with  . Seizures    HPI Krista Young is a 51 y.o. female who presents today with chief complaint sudden onset seizure earlier today which has resolved. She states she developed new onset seizures one month ago and has had a total of 5 seizures this month. She states that at around 12:30 PM today she developed her usual aura of a severe headache and laid down on her couch. She is unsure for how long she seized, and regained consciousness on the floor between her coffee table. The seizure was unwitnessed, friend who accompanied her today states that when she arrived at around 1 PM patient was somewhat confused and aphasic. She does endorse biting her lip and hitting her head on the left side. She is currently being evaluated by a neurologist, and her primary care is managing her seizure medications. Recently her Keppra was increased to 3000 mg daily with the addition of 4 mg of dexamethasone twice a day. Presently she states she still feels "very groggy and confused", with associated nausea and severe generalized headache "feels like it's about to explode". She also endorses blurry vision, photophobia, phonophobia.   She states after first seizure, she underwent [facility], which showed "at first they said it was normal, and they said there was a lesion to the back of my brain that was old".   Denies fevers, chills, CP, SOB, abd pain, v/d   Neurologist Dr. Tyler DeisWheeler PCP Rhetta MuraJune Fawcett Hardy    The history is provided by the patient.    Past Medical History:  Diagnosis Date  . Anxiety   . Bipolar 1 disorder (HCC)   . Depression   . Endometriosis   . History of alcoholism (HCC)   . Hyperlipidemia   . Hypothyroidism   . Myocardial infarction (HCC)   . Obstructive sleep apnea   . Seizures [facility](HCC)     Patient Active  Problem List   Diagnosis Date Noted  . Arm pain, right 02/26/2010  . SLEEP APNEA, OBSTRUCTIVE 09/09/2009  . HYPOTHYROIDISM 10/23/2008  . HYPERLIPIDEMIA 12/10/2007  . BIPOLAR AFFECTIVE DISORDER 12/10/2007  . ANXIETY 12/10/2007  . ALCOHOLISM 12/10/2007  . DEPRESSION 12/10/2007    Past Surgical History:  Procedure Laterality Date  . ABDOMINAL SURGERY     laproscopic sx for endometriosis  . NASAL SINUS SURGERY      OB History    No data available       Home Medications    Prior to Admission medications   Medication Sig Start Date End Date Taking? Authorizing Provider  aspirin EC 81 MG tablet Take 81 mg by mouth daily.   Yes [provider]  atorvastatin (LIPITOR) 20 MG tablet Take 20 mg by mouth at bedtime.   Yes [provider]  dexamethasone (DECADRON) 4 MG tablet Take 4 mg by mouth 2 (two) times daily.   Yes [provider]  escitalopram (LEXAPRO) 20 MG tablet Take 20 mg by mouth every morning.   Yes [provider]  levETIRAcetam (KEPPRA) 1000 MG tablet Take 1,500 mg by mouth 2 (two) times daily.   Yes [provider]  nitroGLYCERIN (NITROSTAT) 0.4 MG SL tablet Place 0.4 mg under the tongue every 5 (five) minutes as needed for chest pain.   Yes [provider]  dicyclomine (BENTYL) 20 MG  tablet Take 1 tablet (20 mg total) by mouth every 6 (six) hours as needed. Patient not taking: Reported on 05/28/2016 08/03/11 05/28/16  Domenick Gong, MD  levothyroxine (SYNTHROID, LEVOTHROID) 50 MCG tablet TAKE 1 TABLET BY MOUTH DAILY (NEW DOSE) Patient not taking: Reported on 05/28/2016 07/30/10   McGill, Marga Hoots, MD  pravastatin (PRAVACHOL) 80 MG tablet Take 1 tablet (80 mg total) by mouth every evening. 06/23/10 06/23/11  Ta, Cat, MD  Probiotic Product (ACIDOPHILUS PROBIOTIC BLEND) CAPS Take 2 capsules by mouth 2 (two) times daily. Patient not taking: Reported on 05/28/2016 08/03/11   Domenick Gong, MD    Family History Family  History  Problem Relation Age of Onset  . Alcohol abuse Mother   . Hypertension Mother   . Hyperlipidemia Mother   . Stroke Mother   . Heart disease Mother   . Alcohol abuse Father   . Heart disease Maternal Aunt   . Cancer Maternal Uncle   . Heart disease Maternal Grandmother     Social History Social History  Substance Use Topics  . Smoking status: Never Smoker  . Smokeless tobacco: Never Used  . Alcohol use No     Allergies   Bee venom; Corn-containing products; Pork-derived products; Latex; and Tape   Review of Systems Review of Systems  Constitutional: Negative for chills and fever.  Eyes: Positive for photophobia and visual disturbance.  Respiratory: Negative for shortness of breath.   Cardiovascular: Negative for chest pain.  Gastrointestinal: Positive for nausea. Negative for abdominal pain, diarrhea and vomiting.  Genitourinary: Negative for dysuria and hematuria.  Skin: Positive for color change and wound.  Neurological: Positive for seizures and headaches.     Physical Exam Updated Vital Signs BP 121/79   Pulse (!) 53   Temp 97.8 F (36.6 C)   Resp 12   SpO2 97%   Physical Exam  Constitutional: She is oriented to person, place, and time. She appears well-developed and well-nourished. No distress.  Somewhat sleepy in appearance, resting comfortably  HENT:  Head: Normocephalic.  Ecchymosis to the left forehead, tender to palpation. No battle signs, no raccoons eyes,  No rhinorrhea. No facial TTP. TMs normal bilaterally, posterior oropharynx clear and dentition normal. There is an area of ecchymosis to the lower lip but no bleeding or lacerations.   Eyes: Conjunctivae and EOM are normal. Right eye exhibits no discharge. Left eye exhibits no discharge. No scleral icterus.  Neck: Normal range of motion. Neck supple. No JVD present. No tracheal deviation present.  No midline C-spine TTP. No deformity or crepitus or step-off noted. Bilateral mild paraspinal  muscle soreness.   Cardiovascular: Normal rate, regular rhythm, normal heart sounds and intact distal pulses.   2+ radial and DP/PT pulses bl, negative Homan's bl   Pulmonary/Chest: Effort normal and breath sounds normal. She exhibits no tenderness.  Abdominal: Soft. Bowel sounds are normal. She exhibits no distension. There is no tenderness.  Musculoskeletal: Normal range of motion. She exhibits no edema, tenderness or deformity.  No midline spine TTP, paraspinal muscle soreness noted in the lumbar spine. Full ROM of BUE and BLE. 5/5 strength of biceps, triceps, wrist, digits, quads, gastrocs and good grip strength  Neurological: She is alert and oriented to person, place, and time. No cranial nerve deficit or sensory deficit.  Fluent speech, no facial droop, sensation intact globally, no pronator drift. Sleepy in appearance and easily falls asleep during examination, but easily arousable.   Skin: Skin is warm and dry. Capillary  refill takes less than 2 seconds. She is not diaphoretic.  Psychiatric: She has a normal mood and affect. Her behavior is normal.     ED Treatments / Results  Labs (all labs ordered are listed, but only abnormal results are displayed) Labs Reviewed  I-STAT CHEM 8, ED - Abnormal; Notable for the following:       Result Value   Calcium, Ion 1.07 (*)    All other components within normal limits  CBG MONITORING, ED  CBG MONITORING, ED    EKG  EKG Interpretation  Date/Time:  Saturday May 28 2016 16:51:06 EDT Ventricular Rate:  53 PR Interval:    QRS Duration: 110 QT Interval:  465 QTC Calculation: 437 R Axis:   28 Text Interpretation:  Sinus rhythm Low voltage, precordial leads Borderline T abnormalities, anterior leads No significant change since last tracing Confirmed by Anitra Lauth  MD, Alphonzo Lemmings ([id number]) on 05/28/2016 5:59:47 PM       Radiology Ct Head Wo Contrast  Result Date: 05/28/2016 CLINICAL DATA:  Seizure.  Head trauma. EXAM: CT HEAD WITHOUT  CONTRAST TECHNIQUE: Contiguous axial images were obtained from the base of the skull through the vertex without intravenous contrast. COMPARISON:  05/03/2016 head CT. FINDINGS: Brain: No evidence of parenchymal hemorrhage or extra-axial fluid collection. No mass lesion, mass effect, or midline shift. No CT evidence of acute infarction. Cerebral volume is age appropriate. No ventriculomegaly. Vascular: No hyperdense vessel or unexpected calcification. Skull: No evidence of calvarial fracture. Sinuses/Orbits: The visualized paranasal sinuses are essentially clear. Other:  The mastoid air cells are unopacified. IMPRESSION: Negative head CT. No evidence of acute intracranial abnormality. No evidence of calvarial fracture. Electronically Signed   By: Delbert Phenix M.D.   On: 05/28/2016 18:07    Procedures Procedures (including critical care time)  Medications Ordered in ED Medications  prochlorperazine (COMPAZINE) injection 10 mg (10 mg Intravenous Given 05/28/16 1708)  diphenhydrAMINE (BENADRYL) injection 25 mg (25 mg Intravenous Given 05/28/16 1708)  sodium chloride 0.9 % bolus 1,000 mL (0 mLs Intravenous Stopped 05/28/16 1917)     Initial Impression / Assessment and Plan / ED Course  I have reviewed the triage vital signs and the nursing notes.  Pertinent labs & imaging results that were available during my care of the patient were reviewed by me and considered in my medical decision making (see chart for details).     Patient with another seizure.  Afebrile, vital signs are stable.  Somewhat slow to answer questions however answers questions appropriately and is A&Ox4.  Neurovascularly intact.  No evidence of fracture or dislocation.  She is in the midst of a neurological workup and medication management,  with upcoming EEG and follow-up with primary care.  With contusion to left forehead,  CT head ordered with no evidence of fracture or acute intracranial abnormality.  Treatment for headache given,  with significant improvement of headache and nausea on reevaluation.  Patient requesting to go home, I believe this is reasonable at this time based on her examination and workup.  Recommend she keep her upcoming appointments and be reevaluated this week.  Discussed strict ED return cautions. Pt verbalized understanding of and agreement with plan and is safe for discharge home at this time.  Final Clinical Impressions(s) / ED Diagnoses   Final diagnoses:  Seizure [facility])  Bad headache  Nausea    New Prescriptions Discharge Medication List as of 05/28/2016  6:46 PM       Michela Pitcher A, PA-C 05/28/16  [id number]    Gwyneth Sprout, MD 05/29/16 2228

## 2016-05-28 NOTE — ED Triage Notes (Addendum)
To ED via pov for eval of seizure. Pt has a new dx of seizures over the past couple of weeks. Pt was talking to a friend on the phone around 1230. A friend came to the home and found pt altered and laying between couch and chair. Alert and oriented on arrival to ED. Pt is seen by [facility] in [place] for neuro. Neuro MD recently increased her Keppra to 4000mg . Pt very tearful and tired. C/o 10/10 HA. With abrasion to right forehead

## 2016-05-28 NOTE — Discharge Instructions (Signed)
Continue taking your seizure medications as prescribed daily. Follow-up with your primary care and Dr. Tyler DeisWheeler your neurologist as scheduled. Return to the ED for any concerning symptoms.

## 2016-05-28 NOTE — ED Notes (Signed)
ED Provider at bedside. 

## 2016-12-23 ENCOUNTER — Other Ambulatory Visit: Payer: Self-pay

## 2016-12-23 ENCOUNTER — Encounter (HOSPITAL_COMMUNITY): Payer: Self-pay

## 2016-12-23 ENCOUNTER — Emergency Department (HOSPITAL_COMMUNITY): Payer: Self-pay

## 2016-12-23 ENCOUNTER — Inpatient Hospital Stay (HOSPITAL_COMMUNITY)
Admission: EM | Admit: 2016-12-23 | Discharge: 2016-12-27 | DRG: 072 | Disposition: A | Discharge status: Home / Self Care | Payer: Self-pay | Attending: Internal Medicine | Admitting: Internal Medicine

## 2016-12-23 DIAGNOSIS — Z91018 Allergy to other foods: Secondary | ICD-10-CM

## 2016-12-23 DIAGNOSIS — R569 Unspecified convulsions: Secondary | ICD-10-CM

## 2016-12-23 DIAGNOSIS — E875 Hyperkalemia: Secondary | ICD-10-CM

## 2016-12-23 DIAGNOSIS — G4733 Obstructive sleep apnea (adult) (pediatric): Secondary | ICD-10-CM | POA: Diagnosis present

## 2016-12-23 DIAGNOSIS — Z6834 Body mass index (BMI) 34.0-34.9, adult: Secondary | ICD-10-CM

## 2016-12-23 DIAGNOSIS — F1021 Alcohol dependence, in remission: Secondary | ICD-10-CM | POA: Diagnosis present

## 2016-12-23 DIAGNOSIS — F319 Bipolar disorder, unspecified: Secondary | ICD-10-CM | POA: Diagnosis present

## 2016-12-23 DIAGNOSIS — Z79899 Other long term (current) drug therapy: Secondary | ICD-10-CM

## 2016-12-23 DIAGNOSIS — E785 Hyperlipidemia, unspecified: Secondary | ICD-10-CM | POA: Diagnosis present

## 2016-12-23 DIAGNOSIS — S01412A Laceration without foreign body of left cheek and temporomandibular area, initial encounter: Secondary | ICD-10-CM | POA: Diagnosis present

## 2016-12-23 DIAGNOSIS — R000 Tachycardia, unspecified: Secondary | ICD-10-CM | POA: Diagnosis present

## 2016-12-23 DIAGNOSIS — Z7952 Long term (current) use of systemic steroids: Secondary | ICD-10-CM

## 2016-12-23 DIAGNOSIS — Z8639 Personal history of other endocrine, nutritional and metabolic disease: Secondary | ICD-10-CM

## 2016-12-23 DIAGNOSIS — G43909 Migraine, unspecified, not intractable, without status migrainosus: Secondary | ICD-10-CM | POA: Diagnosis present

## 2016-12-23 DIAGNOSIS — Z91040 Latex allergy status: Secondary | ICD-10-CM

## 2016-12-23 DIAGNOSIS — F419 Anxiety disorder, unspecified: Secondary | ICD-10-CM | POA: Diagnosis present

## 2016-12-23 DIAGNOSIS — Z91030 Bee allergy status: Secondary | ICD-10-CM

## 2016-12-23 DIAGNOSIS — F3170 Bipolar disorder, currently in remission, most recent episode unspecified: Secondary | ICD-10-CM

## 2016-12-23 DIAGNOSIS — Z91048 Other nonmedicinal substance allergy status: Secondary | ICD-10-CM

## 2016-12-23 DIAGNOSIS — I252 Old myocardial infarction: Secondary | ICD-10-CM

## 2016-12-23 DIAGNOSIS — W19XXXA Unspecified fall, initial encounter: Secondary | ICD-10-CM | POA: Diagnosis present

## 2016-12-23 DIAGNOSIS — F445 Conversion disorder with seizures or convulsions: Secondary | ICD-10-CM | POA: Diagnosis present

## 2016-12-23 DIAGNOSIS — Z811 Family history of alcohol abuse and dependence: Secondary | ICD-10-CM

## 2016-12-23 DIAGNOSIS — Z7982 Long term (current) use of aspirin: Secondary | ICD-10-CM

## 2016-12-23 DIAGNOSIS — E669 Obesity, unspecified: Secondary | ICD-10-CM | POA: Diagnosis present

## 2016-12-23 DIAGNOSIS — G9340 Encephalopathy, unspecified: Principal | ICD-10-CM | POA: Diagnosis present

## 2016-12-23 DIAGNOSIS — Z818 Family history of other mental and behavioral disorders: Secondary | ICD-10-CM

## 2016-12-23 LAB — CBC WITH DIFFERENTIAL/PLATELET
Basophils Absolute: 0 10*3/uL (ref 0.0–0.1)
Basophils Relative: 0 %
Eosinophils Absolute: 0.2 10*3/uL (ref 0.0–0.7)
Eosinophils Relative: 2 %
HEMATOCRIT: 42.3 % (ref 36.0–46.0)
HEMOGLOBIN: 14 g/dL (ref 12.0–15.0)
LYMPHS ABS: 1.9 10*3/uL (ref 0.7–4.0)
LYMPHS PCT: 20 %
MCH: 31 pg (ref 26.0–34.0)
MCHC: 33.1 g/dL (ref 30.0–36.0)
MCV: 93.8 fL (ref 78.0–100.0)
MONOS PCT: 11 %
Monocytes Absolute: 1.1 10*3/uL — ABNORMAL HIGH (ref 0.1–1.0)
Neutro Abs: 6.5 10*3/uL (ref 1.7–7.7)
Neutrophils Relative %: 67 %
Platelets: 315 10*3/uL (ref 150–400)
RBC: 4.51 MIL/uL (ref 3.87–5.11)
RDW: 13.3 % (ref 11.5–15.5)
WBC: 9.7 10*3/uL (ref 4.0–10.5)

## 2016-12-23 LAB — RAPID URINE DRUG SCREEN, HOSP PERFORMED
Amphetamines: NOT DETECTED
Barbiturates: NOT DETECTED
Benzodiazepines: NOT DETECTED
COCAINE: NOT DETECTED
OPIATES: NOT DETECTED
TETRAHYDROCANNABINOL: NOT DETECTED

## 2016-12-23 LAB — CREATININE, SERUM
Creatinine, Ser: 0.79 mg/dL (ref 0.44–1.00)
GFR calc non Af Amer: >60 mL/min (ref >60)

## 2016-12-23 LAB — COMPREHENSIVE METABOLIC PANEL
ALK PHOS: 90 U/L (ref 38–126)
ALT: 50 U/L (ref 14–54)
AST: 45 U/L — ABNORMAL HIGH (ref 15–41)
Albumin: 3.6 g/dL (ref 3.5–5.0)
Anion gap: 11 (ref 5–15)
BILIRUBIN TOTAL: 1.1 mg/dL (ref 0.3–1.2)
BUN: 14 mg/dL (ref 6–20)
CALCIUM: 8.9 mg/dL (ref 8.9–10.3)
CO2: 23 mmol/L (ref 22–32)
CREATININE: 0.85 mg/dL (ref 0.44–1.00)
Chloride: 104 mmol/L (ref 101–111)
Glucose, Bld: 85 mg/dL (ref 65–99)
Potassium: 5.5 mmol/L — ABNORMAL HIGH (ref 3.5–5.1)
SODIUM: 138 mmol/L (ref 135–145)
TOTAL PROTEIN: 6.4 g/dL — AB (ref 6.5–8.1)

## 2016-12-23 LAB — CBC
HEMATOCRIT: 39.5 % (ref 36.0–46.0)
HEMOGLOBIN: 13.1 g/dL (ref 12.0–15.0)
MCH: 31 pg (ref 26.0–34.0)
MCHC: 33.2 g/dL (ref 30.0–36.0)
MCV: 93.4 fL (ref 78.0–100.0)
Platelets: 291 10*3/uL (ref 150–400)
RBC: 4.23 MIL/uL (ref 3.87–5.11)
RDW: 13.3 % (ref 11.5–15.5)
WBC: 9.5 10*3/uL (ref 4.0–10.5)

## 2016-12-23 LAB — VALPROIC ACID LEVEL: Valproic Acid Lvl: 36 ug/mL — ABNORMAL LOW (ref 50.0–100.0)

## 2016-12-23 LAB — ETHANOL: Alcohol, Ethyl (B): <10 mg/dL (ref <10)

## 2016-12-23 LAB — PHENYTOIN LEVEL, TOTAL: PHENYTOIN LVL: 5.3 ug/mL — AB (ref 10.0–20.0)

## 2016-12-23 MED ORDER — SODIUM CHLORIDE 0.9 % IV SOLN: 500.0000 mg | Freq: Once | INTRAVENOUS | Status: DC

## 2016-12-23 MED ORDER — ACETAMINOPHEN 650 MG RE SUPP: 650.0000 mg | Freq: Four times a day (QID) | RECTAL | Status: DC | PRN

## 2016-12-23 MED ORDER — ENOXAPARIN SODIUM 40 MG/0.4ML ~~LOC~~ SOLN: 40.0000 mg | SUBCUTANEOUS | Status: DC

## 2016-12-23 MED ORDER — SODIUM CHLORIDE 0.9 % IV SOLN
1000.0000 mg | Freq: Once | INTRAVENOUS | Status: AC
  Administered 2016-12-23: 1000 mg via INTRAVENOUS
  Filled 2016-12-23: qty 20

## 2016-12-23 MED ORDER — SODIUM CHLORIDE 0.9% FLUSH
3.0000 mL | Freq: Two times a day (BID) | INTRAVENOUS | Status: DC
  Administered 2016-12-23 – 2016-12-26 (×5): 3 mL via INTRAVENOUS

## 2016-12-23 MED ORDER — SODIUM CHLORIDE 0.9 % IV SOLN
500.0000 mg | Freq: Once | INTRAVENOUS | Status: AC
  Administered 2016-12-23: 500 mg via INTRAVENOUS
  Filled 2016-12-23: qty 5

## 2016-12-23 MED ORDER — POLYETHYLENE GLYCOL 3350 17 G PO PACK
17.0000 g | PACK | Freq: Every day | ORAL | Status: DC | PRN
  Administered 2016-12-26: 17 g via ORAL
  Filled 2016-12-23: qty 1

## 2016-12-23 MED ORDER — ASPIRIN EC 81 MG PO TBEC
81.0000 mg | DELAYED_RELEASE_TABLET | Freq: Every day | ORAL | Status: DC
  Administered 2016-12-24 – 2016-12-27 (×4): 81 mg via ORAL
  Filled 2016-12-23 (×4): qty 1

## 2016-12-23 MED ORDER — ESCITALOPRAM OXALATE 20 MG PO TABS
20.0000 mg | ORAL_TABLET | Freq: Every morning | ORAL | Status: DC
  Administered 2016-12-24 – 2016-12-27 (×4): 20 mg via ORAL
  Filled 2016-12-23 (×4): qty 1

## 2016-12-23 MED ORDER — PRAVASTATIN SODIUM 40 MG PO TABS: 80.0000 mg | ORAL_TABLET | Freq: Every evening | ORAL | Status: DC

## 2016-12-23 MED ORDER — LORAZEPAM 2 MG/ML IJ SOLN
1.0000 mg | INTRAMUSCULAR | Status: AC | PRN
  Administered 2016-12-23 – 2016-12-24 (×2): 1 mg via INTRAVENOUS
  Filled 2016-12-23 (×2): qty 1

## 2016-12-23 MED ORDER — SODIUM CHLORIDE 0.9% FLUSH: 3.0000 mL | INTRAVENOUS | Status: DC | PRN

## 2016-12-23 MED ORDER — SODIUM CHLORIDE 0.9 % IV SOLN
INTRAVENOUS | Status: AC
  Administered 2016-12-23: 22:00:00 via INTRAVENOUS

## 2016-12-23 MED ORDER — DIVALPROEX SODIUM 500 MG PO DR TAB
500.0000 mg | DELAYED_RELEASE_TABLET | Freq: Two times a day (BID) | ORAL | Status: DC
  Administered 2016-12-23 – 2016-12-24 (×2): 500 mg via ORAL
  Filled 2016-12-23 (×2): qty 1

## 2016-12-23 MED ORDER — SODIUM CHLORIDE 0.9 % IV SOLN: 1000.0000 mg | Freq: Once | INTRAVENOUS | Status: DC

## 2016-12-23 MED ORDER — PHENYTOIN 50 MG PO CHEW
100.0000 mg | CHEWABLE_TABLET | Freq: Three times a day (TID) | ORAL | Status: DC
  Filled 2016-12-23: qty 2

## 2016-12-23 MED ORDER — LORAZEPAM 2 MG/ML IJ SOLN
1.0000 mg | INTRAMUSCULAR | Status: DC | PRN
Start: 2016-12-23 — End: 2016-12-23

## 2016-12-23 MED ORDER — LORAZEPAM 2 MG/ML IJ SOLN
INTRAMUSCULAR | Status: AC
  Administered 2016-12-23: 1 mg
  Filled 2016-12-23: qty 1

## 2016-12-23 MED ORDER — NITROGLYCERIN 0.4 MG SL SUBL: 0.4000 mg | SUBLINGUAL_TABLET | SUBLINGUAL | Status: DC | PRN

## 2016-12-23 MED ORDER — ONDANSETRON HCL 4 MG/2ML IJ SOLN
4.0000 mg | Freq: Four times a day (QID) | INTRAMUSCULAR | Status: DC | PRN
  Administered 2016-12-24 (×3): 4 mg via INTRAVENOUS
  Filled 2016-12-23 (×3): qty 2

## 2016-12-23 MED ORDER — PHENYTOIN 50 MG PO CHEW
100.0000 mg | CHEWABLE_TABLET | Freq: Three times a day (TID) | ORAL | Status: DC
  Administered 2016-12-24 (×3): 100 mg via ORAL
  Filled 2016-12-23 (×4): qty 2

## 2016-12-23 MED ORDER — DEXTROSE 5 % IV SOLN
500.0000 mg | Freq: Once | INTRAVENOUS | Status: AC
  Administered 2016-12-23: 500 mg via INTRAVENOUS
  Filled 2016-12-23: qty 5

## 2016-12-23 MED ORDER — FENTANYL CITRATE (PF) 100 MCG/2ML IJ SOLN
50.0000 ug | Freq: Once | INTRAMUSCULAR | Status: AC
  Administered 2016-12-23: 50 ug via INTRAVENOUS
  Filled 2016-12-23: qty 2

## 2016-12-23 MED ORDER — SODIUM CHLORIDE 0.9 % IV SOLN
1000.0000 mg | Freq: Once | INTRAVENOUS | Status: DC
  Filled 2016-12-23: qty 20

## 2016-12-23 MED ORDER — FENTANYL CITRATE (PF) 100 MCG/2ML IJ SOLN
50.0000 ug | Freq: Once | INTRAMUSCULAR | Status: AC
Start: 2016-12-23 — End: 2016-12-23
  Administered 2016-12-23: 50 ug via INTRAVENOUS
  Filled 2016-12-23: qty 2

## 2016-12-23 MED ORDER — SODIUM CHLORIDE 0.9 % IV SOLN: 250.0000 mL | INTRAVENOUS | Status: DC | PRN

## 2016-12-23 MED ORDER — ONDANSETRON HCL 4 MG PO TABS: 4.0000 mg | ORAL_TABLET | Freq: Four times a day (QID) | ORAL | Status: DC | PRN

## 2016-12-23 MED ORDER — ATORVASTATIN CALCIUM 20 MG PO TABS
20.0000 mg | ORAL_TABLET | Freq: Every day | ORAL | Status: DC
  Administered 2016-12-23 – 2016-12-25 (×3): 20 mg via ORAL
  Filled 2016-12-23 (×3): qty 1

## 2016-12-23 MED ORDER — SODIUM CHLORIDE 0.9 % IV SOLN
INTRAVENOUS | Status: DC
  Administered 2016-12-23: 16:00:00 via INTRAVENOUS

## 2016-12-23 MED ORDER — LEVOTHYROXINE SODIUM 50 MCG PO TABS
50.0000 ug | ORAL_TABLET | Freq: Every day | ORAL | Status: DC
  Administered 2016-12-24 – 2016-12-26 (×3): 50 ug via ORAL
  Filled 2016-12-23 (×3): qty 1

## 2016-12-23 MED ORDER — ACETAMINOPHEN 325 MG PO TABS
650.0000 mg | ORAL_TABLET | Freq: Four times a day (QID) | ORAL | Status: DC | PRN
  Administered 2016-12-24 – 2016-12-26 (×5): 650 mg via ORAL
  Filled 2016-12-23 (×5): qty 2

## 2016-12-23 NOTE — ED Notes (Signed)
Pt actively seizing, stiff, and shaking violently.

## 2016-12-23 NOTE — Progress Notes (Signed)
Received report from Kevin,RN in the ED. 

## 2016-12-23 NOTE — ED Notes (Signed)
While pt was in CT, this RN noted that pt became tachy on the monitor, ran over to CT and pt was seizing again. Duration 1 minute. Pt now alert and oriented x 4 again.

## 2016-12-23 NOTE — ED Provider Notes (Signed)
[facility] EMERGENCY DEPARTMENT Provider Note   CSN: [id number] Arrival date & time: 12/23/16  1521     History   Chief Complaint Chief Complaint  Patient presents with  . Seizures    HPI Krista Young is a 51 y.o. female.  51 year old female recently diagnosed with seizure disorder about 7 months ago presents after having a witnessed seizure prior to arrival.  Patient states that she had been well controlled on Keppra was recently switched to Dilantin and Depakote.  Has had some recent GI upset.  She states she has been compliant with her medications.  Denies any chronic use of alcohol.  Patient states that she was going to her mailbox today and she woke up in his she had a seizure.  No bladder dysfunction.  No tongue injury.  She did however sustain a laceration to her left cheek.  Now complains of some paraspinal cervical pain without weakness in her arms or legs.  Mild headache as well without recent fever or chills.  She states that she has been under a great deal of stress recently and that this can trigger her seizures      Past Medical History:  Diagnosis Date  . Anxiety   . Bipolar 1 disorder (HCC)   . Depression   . Endometriosis   . History of alcoholism (HCC)   . Hyperlipidemia   . Hypothyroidism   . Myocardial infarction (HCC)   . Obstructive sleep apnea   . Seizures [facility](HCC)     Patient Active Problem List   Diagnosis Date Noted  . Arm pain, right 02/26/2010  . SLEEP APNEA, OBSTRUCTIVE 09/09/2009  . HYPOTHYROIDISM 10/23/2008  . HYPERLIPIDEMIA 12/10/2007  . BIPOLAR AFFECTIVE DISORDER 12/10/2007  . ANXIETY 12/10/2007  . ALCOHOLISM 12/10/2007  . DEPRESSION 12/10/2007    Past Surgical History:  Procedure Laterality Date  . ABDOMINAL SURGERY     laproscopic sx for endometriosis  . NASAL SINUS SURGERY      OB History    No data available       Home Medications    Prior to Admission medications   Medication Sig Start  Date End Date Taking? Authorizing Provider  aspirin EC 81 MG tablet Take 81 mg by mouth daily.    [provider]  atorvastatin (LIPITOR) 20 MG tablet Take 20 mg by mouth at bedtime.    [provider]  dexamethasone (DECADRON) 4 MG tablet Take 4 mg by mouth 2 (two) times daily.    [provider]  dicyclomine (BENTYL) 20 MG tablet Take 1 tablet (20 mg total) by mouth every 6 (six) hours as needed. Patient not taking: Reported on 05/28/2016 08/03/11 05/28/16  Domenick GongMortenson, Ashley, MD  escitalopram (LEXAPRO) 20 MG tablet Take 20 mg by mouth every morning.    [provider]  levETIRAcetam (KEPPRA) 1000 MG tablet Take 1,500 mg by mouth 2 (two) times daily.    [provider]  levothyroxine (SYNTHROID, LEVOTHROID) 50 MCG tablet TAKE 1 TABLET BY MOUTH DAILY (NEW DOSE) Patient not taking: Reported on 05/28/2016 07/30/10   McGill, Marga HootsJacquelyn A, MD  nitroGLYCERIN (NITROSTAT) 0.4 MG SL tablet Place 0.4 mg under the tongue every 5 (five) minutes as needed for chest pain.    [provider]  pravastatin (PRAVACHOL) 80 MG tablet Take 1 tablet (80 mg total) by mouth every evening. 06/23/10 06/23/11  Ta, Cat, MD  Probiotic Product (ACIDOPHILUS PROBIOTIC BLEND) CAPS Take 2 capsules by mouth 2 (two) times  daily. Patient not taking: Reported on 05/28/2016 08/03/11   Domenick Gong, MD    Family History Family History  Problem Relation Age of Onset  . Alcohol abuse Mother   . Hypertension Mother   . Hyperlipidemia Mother   . Stroke Mother   . Heart disease Mother   . Alcohol abuse Father   . Heart disease Maternal Aunt   . Cancer Maternal Uncle   . Heart disease Maternal Grandmother     Social History Social History   Tobacco Use  . Smoking status: Never Smoker  . Smokeless tobacco: Never Used  Substance Use Topics  . Alcohol use: No  . Drug use: No     Allergies   Bee venom; Corn-containing products; Pork-derived products; Latex; and  Tape   Review of Systems Review of Systems  All other systems reviewed and are negative.    Physical Exam Updated Vital Signs BP (!) 161/111 (BP Location: Right Arm)   Pulse 90   Temp 97.8 F (36.6 C) (Oral)   Resp 18   SpO2 99%   Physical Exam  Constitutional: She is oriented to person, place, and time. She appears well-developed and well-nourished.  Non-toxic appearance. No distress.  HENT:  Head: Normocephalic and atraumatic.    Eyes: Conjunctivae, EOM and lids are normal. Pupils are equal, round, and reactive to light.  Neck: Normal range of motion. Neck supple. No tracheal deviation present. No thyroid mass present.    Cardiovascular: Normal rate, regular rhythm and normal heart sounds. Exam reveals no gallop.  No murmur heard. Pulmonary/Chest: Effort normal and breath sounds normal. No stridor. No respiratory distress. She has no decreased breath sounds. She has no wheezes. She has no rhonchi. She has no rales.  Abdominal: Soft. Normal appearance and bowel sounds are normal. She exhibits no distension. There is no tenderness. There is no rebound and no CVA tenderness.  Musculoskeletal: Normal range of motion. She exhibits no edema or tenderness.  Neurological: She is alert and oriented to person, place, and time. She has normal strength. No cranial nerve deficit or sensory deficit. GCS eye subscore is 4. GCS verbal subscore is 5. GCS motor subscore is 6.  Skin: Skin is warm and dry. No abrasion and no rash noted.  Psychiatric: She has a normal mood and affect. Her speech is normal and behavior is normal.  Nursing note and vitals reviewed.    ED Treatments / Results  Labs (all labs ordered are listed, but only abnormal results are displayed) Labs Reviewed  CBC WITH DIFFERENTIAL/PLATELET  COMPREHENSIVE METABOLIC PANEL  ETHANOL  RAPID URINE DRUG SCREEN, HOSP PERFORMED  VALPROIC ACID LEVEL  PHENYTOIN LEVEL, TOTAL    EKG  EKG Interpretation None        Radiology No results found.  Procedures Procedures (including critical care time)  Medications Ordered in ED Medications  LORazepam (ATIVAN) injection 1 mg (not administered)  0.9 %  sodium chloride infusion (not administered)  levETIRAcetam (KEPPRA) 500 mg in sodium chloride 0.9 % 100 mL IVPB (not administered)  LORazepam (ATIVAN) 2 MG/ML injection (1 mg  Given 12/23/16 1540)     Initial Impression / Assessment and Plan / ED Course  I have reviewed the triage vital signs and the nursing notes.  Pertinent labs & imaging results that were available during my care of the patient were reviewed by me and considered in my medical decision making (see chart for details).    LACERATION REPAIR Performed by: Toy Baker Authorized  by: Toy BakerAnthony T Crislyn Willbanks Consent: Verbal consent obtained. Risks and benefits: risks, benefits and alternatives were discussed Consent given by: patient Patient identity confirmed: provided demographic data Prepped and Draped in normal sterile fashion Wound explored  Laceration Location: left facial lac  Laceration Length: 2cm  No Foreign Bodies seen or palpated  Anesthesia: local infiltration  Local anesthetic: lidocaine 2% w/ epinephrine  Anesthetic total: 8 ml  Irrigation method: syringe Amount of cleaning: standard  Skin closure: simple interrupted  Number of sutures: 6  Technique: simple  Patient tolerance: Patient tolerated the procedure well with no immediate complications. Has had 2 seizures while in the department here.  Was treated with Ativan as well as Keppra.  Due to recurrent breakthrough seizures will be admitted to the hospitalist service  Final Clinical Impressions(s) / ED Diagnoses   Final diagnoses:  None    ED Discharge Orders    None       Lorre NickAllen, Bettyjo Lundblad, MD 12/23/16 1820

## 2016-12-23 NOTE — ED Notes (Signed)
Neuro in room.

## 2016-12-23 NOTE — ED Notes (Signed)
This RN flushed pt's IV and pt had no pain, this RN pushed of fentanyl and pt felt burning, then when this RN flushed IV pt had severe burning. IV infiltrated and removed.

## 2016-12-23 NOTE — ED Notes (Signed)
Pt taken to CT.

## 2016-12-23 NOTE — H&P (Signed)
History and Physical    Krista Young NWG:[id number] DOB: 1965-10-15 DOA: 12/23/2016  PCP: De BlanchFawcett-Hardy, June Anne, FNP  Patient coming from: Home    Chief Complaint: Krista KocherSeizre  HPI: Krista Young is a 51 y.o. female with medical history significant of past medical history of seizure that started about 2551-month prior to admission when she was started on single oral antiepileptic drug comes into ED for a witnessed seizure and multiple seizures in the ED after arrival.  She relates she was going to see her primary care doctor who is going to increase her medication as she had just been recently changed to Dilantin and Depakote due to financial reasons.  She relates she has been compliant with all her medications, she denies any alcohol or any drugs she relates she was going to the mailbox today when she woke up after she had a seizure after fall she did not bite her injury lose control over her sphincters, she did sustain a laceration on the left side of her cheek and is now having some facial pain she says is bearable.  ED Course:  She had multiple seizures in the ED she had to be loaded with Keppra and ED physician also gave him IV Depakote. She has mild hyperkalemia mildly tachycardic.  Her alcohol level is less than 10 CT scan of the C-spine showed no fractures, her Dilantin level was 5 her Depakote level was 36.  Review of Systems: As per HPI otherwise 10 point review of systems negative.    Past Medical History:  Diagnosis Date  . Anxiety   . Bipolar 1 disorder (HCC)   . Depression   . Endometriosis   . History of alcoholism (HCC)   . Hyperlipidemia   . Hypothyroidism   . Myocardial infarction (HCC)   . Obstructive sleep apnea   . Seizures (HCC)     Past Surgical History:  Procedure Laterality Date  . ABDOMINAL SURGERY     laproscopic sx for endometriosis  . NASAL SINUS SURGERY       reports that  has never smoked. she has never used smokeless tobacco. She  reports that she does not drink alcohol or use drugs.  Allergies  Allergen Reactions  . Bee Venom Shortness Of Breath and Rash    Welts  . Corn-Containing Products Anaphylaxis    And headaches  . Pork-Derived Products Anaphylaxis    And headaches  . Latex Itching, Rash and Other (See Comments)    (And) feels like her "body is on fire"  . Tape Itching and Rash    Family History  Problem Relation Age of Onset  . Alcohol abuse Mother   . Hypertension Mother   . Hyperlipidemia Mother   . Stroke Mother   . Heart disease Mother   . Alcohol abuse Father   . Heart disease Maternal Aunt   . Cancer Maternal Uncle   . Heart disease Maternal Grandmother      Prior to Admission medications   Medication Sig Start Date End Date Taking? Authorizing Provider  aspirin EC 81 MG tablet Take 81 mg by mouth daily.    [provider]  atorvastatin (LIPITOR) 20 MG tablet Take 20 mg by mouth at bedtime.    [provider]  dexamethasone (DECADRON) 4 MG tablet Take 4 mg by mouth 2 (two) times daily.    [provider]  dicyclomine (BENTYL) 20 MG tablet Take 1 tablet (20 mg total) by mouth every 6 (six) hours  as needed. Patient not taking: Reported on 05/28/2016 08/03/11 05/28/16  Domenick GongMortenson, Ashley, MD  escitalopram (LEXAPRO) 20 MG tablet Take 20 mg by mouth every morning.    [provider]  levETIRAcetam (KEPPRA) 1000 MG tablet Take 1,500 mg by mouth 2 (two) times daily.    [provider]  levothyroxine (SYNTHROID, LEVOTHROID) 50 MCG tablet TAKE 1 TABLET BY MOUTH DAILY (NEW DOSE) Patient not taking: Reported on 05/28/2016 07/30/10   McGill, Marga HootsJacquelyn A, MD  nitroGLYCERIN (NITROSTAT) 0.4 MG SL tablet Place 0.4 mg under the tongue every 5 (five) minutes as needed for chest pain.    [provider]  pravastatin (PRAVACHOL) 80 MG tablet Take 1 tablet (80 mg total) by mouth every evening. 06/23/10 06/23/11  Ta, Cat, MD  Probiotic Product (ACIDOPHILUS  PROBIOTIC BLEND) CAPS Take 2 capsules by mouth 2 (two) times daily. Patient not taking: Reported on 05/28/2016 08/03/11   Domenick GongMortenson, Ashley, MD    Physical Exam: Vitals:   12/23/16 1630 12/23/16 1658 12/23/16 1700 12/23/16 1730  BP: 110/75 113/67 119/69 113/65  Pulse: (!) 101 (!) 111 (!) 111 (!) 105  Resp: (!) 21 (!) 26 (!) 27 (!) 23  Temp:      TempSrc:      SpO2: 95% 99% 96% 97%    Constitutional: NAD, calm, comfortable Vitals:   12/23/16 1630 12/23/16 1658 12/23/16 1700 12/23/16 1730  BP: 110/75 113/67 119/69 113/65  Pulse: (!) 101 (!) 111 (!) 111 (!) 105  Resp: (!) 21 (!) 26 (!) 27 (!) 23  Temp:      TempSrc:      SpO2: 95% 99% 96% 97%   Eyes: PERRL, lids and conjunctivae normal ENMT: Mucous membranes are moist.  She has a laceration on the left side of her cheek Neck: normal, supple, no masses, no thyromegaly dry but in her neck and chest Respiratory: Good air movement clear to auscultation. Cardiovascular: Regular rate and rhythm, no murmurs / rubs / gallops. No extremity edema. 2+ pedal pulses. No carotid bruits.  Abdomen: no tenderness, no masses palpated. No hepatosplenomegaly. Bowel sounds positive.  Musculoskeletal: no clubbing / cyanosis. No joint deformity upper and lower extremities. Good ROM, no contractures. Normal muscle tone.  Skin: no rashes, lesions, ulcers. No induration Neurologic: CN 2-12 grossly intact. Sensation intact, DTR normal. Strength 5/5 in all 4.  Psychiatric: Normal judgment and insight. Alert and oriented x 3. Normal mood.     Labs on Admission: I have personally reviewed following labs and imaging studies  CBC: Recent Labs  Lab 12/23/16 1557  WBC 9.7  NEUTROABS 6.5  HGB 14.0  HCT 42.3  MCV 93.8  PLT 315   Basic Metabolic Panel: Recent Labs  Lab 12/23/16 1557  NA 138  K 5.5*  CL 104  CO2 23  GLUCOSE 85  BUN 14  CREATININE 0.85  CALCIUM 8.9   GFR: CrCl cannot be calculated (Unknown ideal weight.). Liver Function  Tests: Recent Labs  Lab 12/23/16 1557  AST 45*  ALT 50  ALKPHOS 90  BILITOT 1.1  PROT 6.4*  ALBUMIN 3.6   No results for input(s): LIPASE, AMYLASE in the last 168 hours. No results for input(s): AMMONIA in the last 168 hours. Coagulation Profile: No results for input(s): INR, PROTIME in the last 168 hours. Cardiac Enzymes: No results for input(s): CKTOTAL, CKMB, CKMBINDEX, TROPONINI in the last 168 hours. BNP (last 3 results) No results for input(s): PROBNP in the last 8760 hours. HbA1C: No results for  input(s): HGBA1C in the last 72 hours. CBG: No results for input(s): GLUCAP in the last 168 hours. Lipid Profile: No results for input(s): CHOL, HDL, LDLCALC, TRIG, CHOLHDL, LDLDIRECT in the last 72 hours. Thyroid Function Tests: No results for input(s): TSH, T4TOTAL, FREET4, T3FREE, THYROIDAB in the last 72 hours. Anemia Panel: No results for input(s): VITAMINB12, FOLATE, FERRITIN, TIBC, IRON, RETICCTPCT in the last 72 hours. Urine analysis:    Component Value Date/Time   COLORURINE YELLOW 12/02/2010 0101   APPEARANCEUR CLOUDY (A) 12/02/2010 0101   LABSPEC 1.020 08/03/2011 1834   PHURINE 5.5 08/03/2011 1834   GLUCOSEU 100 (A) 08/03/2011 1834   HGBUR TRACE (A) 08/03/2011 1834   BILIRUBINUR MODERATE (A) 08/03/2011 1834   KETONESUR TRACE (A) 08/03/2011 1834   PROTEINUR 30 (A) 08/03/2011 1834   UROBILINOGEN 1.0 08/03/2011 1834   NITRITE NEGATIVE 08/03/2011 1834   LEUKOCYTESUR SMALL (A) 08/03/2011 1834    Radiological Exams on Admission: Ct Head Wo Contrast  Result Date: 12/23/2016 CLINICAL DATA:  Unwitnessed seizure today. Fall with laceration to the left side of the face. EXAM: CT HEAD WITHOUT CONTRAST CT CERVICAL SPINE WITHOUT CONTRAST TECHNIQUE: Multidetector CT imaging of the head and cervical spine was performed following the standard protocol without intravenous contrast. Multiplanar CT image reconstructions of the cervical spine were also generated. COMPARISON:   10/27/2016 and multiple previous. FINDINGS: CT HEAD FINDINGS Brain: No evidence of malformation, atrophy, old or acute small or large vessel infarction, mass lesion, hemorrhage, hydrocephalus or extra-axial collection. No evidence of pituitary lesion. Vascular: No vascular calcification.  No hyperdense vessels. Skull: Normal.  No fracture or focal bone lesion. Sinuses/Orbits: Visualized sinuses are clear. No fluid in the middle ears or mastoids. Visualized orbits are normal. Other: None significant CT CERVICAL SPINE FINDINGS Alignment: Straightening of the normal cervical lordosis, likely positional. Skull base and vertebrae: No fracture or focal lesion. Soft tissues and spinal canal: Negative Disc levels: Degenerative spondylosis with endplate osteophytes at C4-5, C5-6 and C6-7. Upper chest: Negative Other: None IMPRESSION: Head CT: Normal. No traumatic finding. No cause of seizure identified. Cervical spine CT: No acute or traumatic finding. Ordinary chronic cervical spondylosis. Electronically Signed   By: Paulina Fusi M.D.   On: 12/23/2016 17:03   Ct Cervical Spine Wo Contrast  Result Date: 12/23/2016 CLINICAL DATA:  Unwitnessed seizure today. Fall with laceration to the left side of the face. EXAM: CT HEAD WITHOUT CONTRAST CT CERVICAL SPINE WITHOUT CONTRAST TECHNIQUE: Multidetector CT imaging of the head and cervical spine was performed following the standard protocol without intravenous contrast. Multiplanar CT image reconstructions of the cervical spine were also generated. COMPARISON:  10/27/2016 and multiple previous. FINDINGS: CT HEAD FINDINGS Brain: No evidence of malformation, atrophy, old or acute small or large vessel infarction, mass lesion, hemorrhage, hydrocephalus or extra-axial collection. No evidence of pituitary lesion. Vascular: No vascular calcification.  No hyperdense vessels. Skull: Normal.  No fracture or focal bone lesion. Sinuses/Orbits: Visualized sinuses are clear. No fluid in the  middle ears or mastoids. Visualized orbits are normal. Other: None significant CT CERVICAL SPINE FINDINGS Alignment: Straightening of the normal cervical lordosis, likely positional. Skull base and vertebrae: No fracture or focal lesion. Soft tissues and spinal canal: Negative Disc levels: Degenerative spondylosis with endplate osteophytes at C4-5, C5-6 and C6-7. Upper chest: Negative Other: None IMPRESSION: Head CT: Normal. No traumatic finding. No cause of seizure identified. Cervical spine CT: No acute or traumatic finding. Ordinary chronic cervical spondylosis. Electronically Signed   By: Loraine Leriche  Shogry M.D.   On: 12/23/2016 17:03    EKG: Independently reviewed. none  Assessment/Plan Recurrent Seizure (HCC) Unclear whether noncompliance or undermedicated.  Levels were low and she insists she was taking them religiously. She did have multiple seizures here in the ED she was given 2 mg of IV Ativan and they seem to be controlled. ED load her with IV Keppra. I will give her a 1 gram of IV Dilantin, and 500 mg of IV Depakote. I will start her tomorrow morning on Depakote 500 mg twice daily and Dilantin 100 mg 3 times daily. Use Ativan IV as needed procedures.  BIPOLAR AFFECTIVE DISORDER No changes made to medication continue current regimen.  SLEEP APNEA, OBSTRUCTIVE  Hyperkalemia Due to seizures we will give her gentle IV fluid hydration check a basic metabolic panel in the morning.   DVT prophylaxis: Lovenox Code Status: full Family Communication: none  Disposition Plan:  Consults called: none Admission status: inaptient   Marinda Elk MD [facility] Pager [phone]  If 7PM-7AM, please contact night-coverage [email or web address] Password [email or web address]  12/23/2016, 6:00 PM

## 2016-12-23 NOTE — ED Notes (Signed)
Pt now alert and oriented. Dr Freida BusmanAllen at bedside.

## 2016-12-23 NOTE — ED Triage Notes (Addendum)
Per EMS, pt from home, unwitnessed seizure, pt last remembers walking out to the mailbox. Pt does not remember after walking to the mailbox and woke up behind her car on her side of the street. Pt recently had a change in seizure meds recently. Just diagnosed with seizures in April. Pt alert and oriented x 4 now. VS 172/100, hr 102, RR 20, 97% on RA, CBG 99, NSR. Given 4mg  zofran PO for nausea.

## 2016-12-24 ENCOUNTER — Other Ambulatory Visit: Payer: Self-pay

## 2016-12-24 DIAGNOSIS — R569 Unspecified convulsions: Secondary | ICD-10-CM

## 2016-12-24 LAB — BASIC METABOLIC PANEL
Anion gap: 9 (ref 5–15)
BUN: 9 mg/dL (ref 6–20)
CHLORIDE: 105 mmol/L (ref 101–111)
CO2: 27 mmol/L (ref 22–32)
Calcium: 8.4 mg/dL — ABNORMAL LOW (ref 8.9–10.3)
Creatinine, Ser: 0.7 mg/dL (ref 0.44–1.00)
GFR calc Af Amer: >60 mL/min (ref >60)
GFR calc non Af Amer: >60 mL/min (ref >60)
GLUCOSE: 121 mg/dL — AB (ref 65–99)
POTASSIUM: 4 mmol/L (ref 3.5–5.1)
Sodium: 141 mmol/L (ref 135–145)

## 2016-12-24 LAB — HIV ANTIBODY (ROUTINE TESTING W REFLEX): HIV Screen 4th Generation wRfx: NONREACTIVE

## 2016-12-24 MED ORDER — LORAZEPAM 2 MG/ML IJ SOLN
1.0000 mg | Freq: Once | INTRAMUSCULAR | Status: AC
  Administered 2016-12-24: 1 mg via INTRAVENOUS

## 2016-12-24 MED ORDER — DIVALPROEX SODIUM 250 MG PO DR TAB
250.0000 mg | DELAYED_RELEASE_TABLET | Freq: Three times a day (TID) | ORAL | Status: DC
  Administered 2016-12-24 – 2016-12-25 (×4): 250 mg via ORAL
  Filled 2016-12-24 (×5): qty 1

## 2016-12-24 MED ORDER — LORAZEPAM 2 MG/ML IJ SOLN
INTRAMUSCULAR | Status: AC
  Administered 2016-12-24: 1 mg via INTRAVENOUS
  Filled 2016-12-24: qty 1

## 2016-12-24 NOTE — Progress Notes (Signed)
Patient arrived from ED A&O x4. Telemetry box 5W MX40-23 applied and second verified. Skin assessment completed by this RN and Orlene OchAlvesha RN. Pt instructed on how to use the call bell and the telephone. Patient's personal medications were counted and taken to pharmacy. Will continue to monitor and treat per MD orders.

## 2016-12-24 NOTE — Progress Notes (Signed)
NT called out for RN stating pt actively having seizure. Pt pulling at lines, body jerking and pt kicking. Event lasted 8 minutes. Pt had to be reoriented. Md notified.

## 2016-12-24 NOTE — Progress Notes (Signed)
Upon arrival into Pt's room to give morning meds, Pt was found to be having a seizure. NT was at bedside and stayed while this RN went to get Ativan. 1mg  Ativan given IV. Seizure lasted about 5 min. MD paged/notified. Pt stable and now A&O. Will continue to monitor.

## 2016-12-24 NOTE — Progress Notes (Signed)
At 9:40pm, pt had an active seizure. She started shaking, followed by becoming stiff and raising her body off of the bed. She became somewhat combative, but did not try to hit any staff. Patient began trying to climb up out of the bed.  Patient then calmed down and became A&O x4 but very lethargic. PRN ativan was given. Patient then asked, "What happened?" RN then told the patient she had, had another seizure and she was not aware of it. RN continued to talk with the patient and she calmed down and then went to sleep. Will continue to monitor and treat per MD orders.

## 2016-12-24 NOTE — Consult Note (Addendum)
Neurology Consultation Reason for Consult: Seizure Referring Physician: Arrien   History is obtained from: Extensive chart review, patient   HPI: Krista Young is a 51 y.o. female past medical history of seizure-like activity, bipolar disorder, alcohol abuse,  Hyperlipidemia presents with multiple seizure-like activity.  Patient presented yesterday to the ER after having multiple seizure-like activity. She was loaded with Keppra as well as IV Depakote. She takes Depakote and Dilantin at home. Dilantin was administered by her PCP for seizures. Dilantin level was 5 and Depakote level was 36. Prior to that she was on multiple seizure medications was weaned off after her EMU admission in September 2018. Neurology was consulted and she continued to have multiple seizures while inpatient.  The most recent seizure like spell was one that occurred in the morning. Nurse characterizes as her leg to the side and shaking and then turning over with shaking of all 4 extremities and flailing of her arms. She received 1 mg of Ativan. There was no longer postictal. Should shortly back to her baseline.  Chart review from Care everywhere:  I reviewed outside [facility] - patient has 6 events, including events characterized by arching of her back, rhythmic jerking, volitional grasping of bed rail,  pelvic thrusting and unresponsiveness. All events  captured were nonepileptic on EEG. Her background EEG pattern was also normal and showed no epileptiform activity during her entire according.  She also had a routine EEG done on neurologist office showed no epileptiform activity and just some slowing of the temporal lobe.  [facility] was on other seizure medications. There were discontinued on discharged. She was continued on Depakote for migraines/headaches and not seizures.   ROS: A 14 point ROS was performed and is negative except as noted  in the HPI.   Past Medical History:  Diagnosis Date  . Anxiety   . Bipolar 1 disorder (HCC)   . Depression   . Endometriosis   . History of alcoholism (HCC)   . Hyperlipidemia   . Hypothyroidism   . Myocardial infarction (HCC)   . Obstructive sleep apnea   . Seizures (HCC)      Family History  Problem Relation Age of Onset  . Alcohol abuse Mother   . Hypertension Mother   . Hyperlipidemia Mother   . Stroke Mother   . Heart disease Mother   . Alcohol abuse Father   . Heart disease Maternal Aunt   . Cancer Maternal Uncle   . Heart disease Maternal Grandmother      Social History:  reports that  has never smoked. she has never used smokeless tobacco. She reports that she does not drink alcohol or use drugs.   Exam: Current vital signs: BP (!) 143/74 (BP Location: Left Arm)   Pulse 100   Temp 98.2 F (36.8 C) (Oral)   Resp 18   Ht 5\' 8"  (1.727 m)   Wt 102.6 kg (226 lb 4.8 oz)   SpO2 97%   BMI 34.41 kg/m  Vital signs in last 24 hours: Temp:  [98 F (36.7 C)-98.2 F (36.8 C)] 98.2 F (36.8 C) (12/08 2113) Pulse Rate:  [100-120] 100 (12/08 2113) Resp:  [17-18] 18 (12/08 2113) BP: (121-143)/(64-81) 143/74 (12/08 2113) SpO2:  [96 %-99 %] 97 % (12/08 2113) Weight:  [102.6 kg (226 lb 4.8 oz)] 102.6 kg (226 lb 4.8 oz) (12/08 0640)   Physical Exam  Constitutional: Appears well-developed and well-nourished.  Psych: Affect appropriate to situation Eyes: No scleral injection HENT: No OP obstrucion, Head: Normocephalic.  laceration on left cheek Cardiovascular: Normal rate and regular rhythm.  Respiratory: Effort normal, non-labored breathing GI: Soft.  No distension. There is no tenderness.  Skin: WDI  Neuro: Mental Status: Patient is awake, alert, oriented to person, place, month, year, and situation. Patient is able to give a clear and coherent history. No signs of aphasia or neglect Cranial Nerves: II: Visual Fields are full. Pupils are equal, round, and  reactive to light.   III,IV, VI: EOMI without ptosis or diploplia.  V: Facial sensation is symmetric to temperature VII: Facial movement is symmetric.  VIII: hearing is intact to voice X: Uvula elevates symmetrically XI: Shoulder shrug is symmetric. XII: tongue is midline without atrophy or fasciculations.  Motor: Tone is normal. Bulk is normal. 5/5 strength was present in all four extremities.  Sensory: Sensation is symmetric to light touch and temperature in the arms and legs. Plantars: Toes are downgoing bilaterally.  Cerebellar: FNF and HKS are intact bilaterally   ASSESSMENT   Likely nonepileptic spells   [facility] - patient has 6 events, including events characterized by arching of her back, rhythmic jerking, volitional grasping of Benadryl,  pelvic thrusting and unresponsiveness. All her events were nonepileptic in her background EEG showed no epileptiform activity. She was taken off AEDs during her EMU stay. She also had a routine EEG done on neurologist office showed no epileptiform activity and just some slowing of the temporal lobe.  Episode that occurred this morning also appears to be nonepileptic based on the description the nurse provided me and in sounds similar to the episodes [facility].   When when I asked the patient regarding her diagnosis- she states she was never informed of diagnosis of nonepileptic events. When asked [facility]- she told me that she has to neurologist to leave her room as she was making her feel "dumb"  She also states that her neurologist Dr Tyler DeisWheeler  told she was having a seizure.  This is inconsistent  with what is mentioned on her outside medical records.  I spent time explaining to her the findings of [facility] and diagnosis of nonepileptic spells. I also stated that I cannot say definitely  the spells she experienced here are nonepileptic as I have no access to her EEG done at [facility]. I offered her to repeat long-term EEG monitoring however the patient declined and said that she would rather have this done as an outpatient.  Recommendations   Most likely nonepileptic spells, these [facility] Based on her diagnosis of nonepileptic spells and nurses description that seems less consistent with true seizure, there is no need to increase her seizure medications and would continue on what she is taking at home.  Instructed patient while they may be nonepileptic spells, they still could be dangerous and advise of seizure precautions including no driving. Please provide patient with resources for cognitive behavioral therapy.  Neurology will sign off.   Georgiana SpinnerSushanth Aroor MD [facility] [phone]  If 7pm to 7am, please call on call as listed on AMION.

## 2016-12-24 NOTE — Progress Notes (Signed)
PROGRESS NOTE    Krista SineCatherine J Clawson  QMV:[id number]:[id number] DOB: February 23, 1965 DOA: 12/23/2016 PCP: De BlanchFawcett-Hardy, June Anne, FNP    Brief Narrative:  51 year old female who presented with seizure. Patient does have a significant past medical history of epilepsy. She presented with a generalized seizure at home, and multiple seizures that her arrival to the emergency department. Apparently her antiepileptic medications have been progressively modified in the outpatient setting, change from phenytoin to valproic acid. On initial physical examination blood pressure 110/75, heart rate 111, respiratory rate 23, oxygen saturation 96%. Moist mucous membranes, lungs clear to auscultation bilaterally, no wheezing rales or rhonchi, heart S1-S2 present rhythmic, abdomen soft nontender, no lower extremity edema. Sodium 138, potassium 5.5, chloride 104, bicarbonate 23, glucose 85, B at 14, creatinine 0.85, AST 45, AST 50, white count 8.7, hemoglobin 14.0 hematocrit 42.3, platelets 315. Phenytoin level 5.3 (low), valproic acid 36 (low). Urine drug screen negative. Head and neck CT with no acute findings.  Patient was admitted to the hospital working diagnosis of uncontrolled seizures.   Assessment & Plan:   Active Problems:   BIPOLAR AFFECTIVE DISORDER   SLEEP APNEA, OBSTRUCTIVE   Seizure (HCC)   Hyperkalemia   1. Change in mental status. I personally [facility] and discussed the case with neurology. No clear evidence of seizure activity, eeg have been not epileptic. Will continue observation for 24 hours on her home medications per neurology recommendations.   2. Bipolar. Will consult pshyc, patient may have conversion disorder.   3. Sleep apnea. Will need outpatient follow up.   4. Hyperkalemia. Serum K down to 4,0, renal function continue to be preserved with serum cr at 0.70.   DVT prophylaxis: enoxaparin  Code Status:  full Family Communication:  Disposition Plan:   Home   Consultants:   Neurology   Procedures:     Antimicrobials:       Subjective: Reported seizure this am generalized that required lorazepam. By the time of my examination she is awake and alert. Mentions being under increase psychological stress, sleeping only 3 hours per day.   Objective: Vitals:   12/23/16 1700 12/23/16 1730 12/23/16 2031 12/24/16 0640  BP: 119/69 113/65 (!) 121/53 121/81  Pulse: (!) 111 (!) 105 87 (!) 120  Resp: (!) 27 (!) 23 17 17   Temp:   98.1 F (36.7 C) 98 F (36.7 C)  TempSrc:   Oral Oral  SpO2: 96% 97% 99% 99%  Weight:    102.6 kg (226 lb 4.8 oz)  Height:    5\' 8"  (1.727 m)    Intake/Output Summary (Last 24 hours) at 12/24/2016 1033 Last data filed at 12/24/2016 0640 Gross per 24 hour  Intake 968 ml  Output 450 ml  Net 518 ml   Filed Weights   12/24/16 0640  Weight: 102.6 kg (226 lb 4.8 oz)    Examination:   General: Not in pain or dyspnea, deconditioned Neurology: Awake and alert, non focal  E ENT: no pallor, no icterus, oral mucosa moist Cardiovascular: No JVD. S1-S2 present, rhythmic, no gallops, rubs, or murmurs. No lower extremity edema. Pulmonary: vesicular breath sounds bilaterally, adequate air movement, no wheezing, rhonchi or rales. Gastrointestinal. Abdomen protuberant, no organomegaly, non tender, no rebound or guarding Skin. No rashes Musculoskeletal: no joint deformities     Data Reviewed: I have personally reviewed following labs and imaging studies  CBC: Recent Labs  Lab 12/23/16 1557 12/23/16 2116  WBC 9.7 9.5  NEUTROABS 6.5  --  HGB 14.0 13.1  HCT 42.3 39.5  MCV 93.8 93.4  PLT 315 291   Basic Metabolic Panel: Recent Labs  Lab 12/23/16 1557 12/23/16 2116 12/24/16 0518  NA 138  --  141  K 5.5*  --  4.0  CL 104  --  105  CO2 23  --  27  GLUCOSE 85  --  121*  BUN 14  --  9  CREATININE 0.85 0.79 0.70  CALCIUM 8.9  --  8.4*   GFR: Estimated Creatinine Clearance: 106.6 mL/min (by C-G  formula based on SCr of 0.7 mg/dL). Liver Function Tests: Recent Labs  Lab 12/23/16 1557  AST 45*  ALT 50  ALKPHOS 90  BILITOT 1.1  PROT 6.4*  ALBUMIN 3.6   No results for input(s): LIPASE, AMYLASE in the last 168 hours. No results for input(s): AMMONIA in the last 168 hours. Coagulation Profile: No results for input(s): INR, PROTIME in the last 168 hours. Cardiac Enzymes: No results for input(s): CKTOTAL, CKMB, CKMBINDEX, TROPONINI in the last 168 hours. BNP (last 3 results) No results for input(s): PROBNP in the last 8760 hours. HbA1C: No results for input(s): HGBA1C in the last 72 hours. CBG: No results for input(s): GLUCAP in the last 168 hours. Lipid Profile: No results for input(s): CHOL, HDL, LDLCALC, TRIG, CHOLHDL, LDLDIRECT in the last 72 hours. Thyroid Function Tests: No results for input(s): TSH, T4TOTAL, FREET4, T3FREE, THYROIDAB in the last 72 hours. Anemia Panel: No results for input(s): VITAMINB12, FOLATE, FERRITIN, TIBC, IRON, RETICCTPCT in the last 72 hours.    Radiology Studies: I have reviewed all of the imaging during this hospital visit personally     Scheduled Meds: . aspirin EC  81 mg Oral Daily  . atorvastatin  20 mg Oral QHS  . divalproex  500 mg Oral Q12H  . escitalopram  20 mg Oral q morning - 10a  . levothyroxine  50 mcg Oral QAC breakfast  . phenytoin  100 mg Oral TID  . sodium chloride flush  3 mL Intravenous Q12H   Continuous Infusions: . sodium chloride       LOS: 1 day        Garielle Mroz Annett Gulaaniel Katalin Colledge, MD [facility] Pager [phone]

## 2016-12-24 NOTE — Progress Notes (Signed)
Pt found having seizure like activity in bed. Pt was holding onto bed rails, body stiff and jerking. Event lasted around 5 min and Pt came back around, talking and oriented. HR was tachy, nonsustained. MD notified. Will continue to monitor.

## 2016-12-25 DIAGNOSIS — Z818 Family history of other mental and behavioral disorders: Secondary | ICD-10-CM

## 2016-12-25 DIAGNOSIS — F331 Major depressive disorder, recurrent, moderate: Secondary | ICD-10-CM

## 2016-12-25 DIAGNOSIS — F0630 Mood disorder due to known physiological condition, unspecified: Secondary | ICD-10-CM

## 2016-12-25 DIAGNOSIS — Z811 Family history of alcohol abuse and dependence: Secondary | ICD-10-CM

## 2016-12-25 LAB — VALPROIC ACID LEVEL: VALPROIC ACID LVL: 31 ug/mL — AB (ref 50.0–100.0)

## 2016-12-25 LAB — HEPATIC FUNCTION PANEL
ALBUMIN: 3.3 g/dL — AB (ref 3.5–5.0)
ALT: 29 U/L (ref 14–54)
AST: 22 U/L (ref 15–41)
Alkaline Phosphatase: 77 U/L (ref 38–126)
Bilirubin, Direct: <0.1 mg/dL — ABNORMAL LOW (ref 0.1–0.5)
Total Bilirubin: 0.3 mg/dL (ref 0.3–1.2)
Total Protein: 6 g/dL — ABNORMAL LOW (ref 6.5–8.1)

## 2016-12-25 LAB — AMMONIA: AMMONIA: 56 umol/L — AB (ref 9–35)

## 2016-12-25 MED ORDER — PHENYTOIN SODIUM EXTENDED 100 MG PO CAPS
100.0000 mg | ORAL_CAPSULE | Freq: Three times a day (TID) | ORAL | Status: DC
  Administered 2016-12-25 – 2016-12-27 (×7): 100 mg via ORAL
  Filled 2016-12-25 (×7): qty 1

## 2016-12-25 MED ORDER — KETOROLAC TROMETHAMINE 15 MG/ML IJ SOLN
15.0000 mg | Freq: Once | INTRAMUSCULAR | Status: AC
  Administered 2016-12-25: 15 mg via INTRAVENOUS
  Filled 2016-12-25: qty 1

## 2016-12-25 NOTE — Progress Notes (Signed)
Pt was having seizure activity in room. Housekeeping alerted staff. Neuro MD in room to witness event. Pt was side lying, stiff and jerking. Then became aggressive and somewhat combative, swinging arms and legs, attempting to get out of bed multiple times. 4 staff were present to keep Pt safe including MD. Pt was calmly told to stay in bed and that she was ok. Event lasted 7-10 min. Pt became calmer and sat back in bed, made aware of event. Continue monitoring.

## 2016-12-25 NOTE — Consult Note (Addendum)
[facility] Consult   Reason for Consult:  History of seizures.;  Referring Physician:  Unit physician Patient Identification: Krista Young MRN:  [id number] Principal Diagnosis: <principal problem not specified> Diagnosis:   Patient Active Problem List   Diagnosis Date Noted  . Seizure ([place]) [R56.9] 12/23/2016  . Hyperkalemia [E87.5] 12/23/2016  . Arm pain, right [M79.601] 02/26/2010  . SLEEP APNEA, OBSTRUCTIVE [G47.33] 09/09/2009  . HYPOTHYROIDISM [E03.9] 10/23/2008  . HYPERLIPIDEMIA [E78.5] 12/10/2007  . BIPOLAR AFFECTIVE DISORDER [F31.9] 12/10/2007  . ANXIETY [F41.1] 12/10/2007  . ALCOHOLISM [F10.20] 12/10/2007  . DEPRESSION [F32.9] 12/10/2007    Total Time spent with patient: 1 hour  Subjective:   Krista Young is a 51 y.o. female patient admitted with seizures.Marland Kitchen  HPI:  As noted  "Krista Young is a 51 y.o. female with medical history significant of past medical history of seizure that started about 37-monthprior to admission when she was started on single oral antiepileptic drug comes into ED for a witnessed seizure and multiple seizures in the ED after arrival.  She relates she was going to see her primary care doctor who is going to increase her medication as she had just been recently changed to Dilantin and Depakote due to financial reasons.  She relates she has been compliant with all her medications, she denies any alcohol or any drugs she relates she was going to the mailbox today when she woke up after she had a seizure after fall she did not bite her injury lose control over her sphincters, she did sustain a laceration on the left side of her cheek and is now having some facial pain she says is bearable."  Psych consult  On evaluation today she was not too sure why pysch was consulted and we reviewed history and stressors. Last year has been upsetting with depression and losses. She did loose her job and has been having seizures applied  for disability says cant drive and all these have added more to her depression her primary care started her on lexapro  2 months ago that has helped some.  Still worries, anxiety is there and financially difficult situation of mantaining housing as well Denies depression to the point of hopelessness or suicide. Has some support but her focus remains on seizure and its effect, says  Her mom had seizures as well in her late life  Have read neurology and other notes.  Explained some non epileptic seizures as or part of additional seizures.   She is on depakote for last few months that may have helped her depression as well by contributing to mood. She has noticed some better in regard to depression in last 2 months but still worries and remains dysthymic       Past Psychiatric History: depression . [facility]  Risk to Self: Is patient at risk for suicide?: No Risk to Others:   Prior Inpatient Therapy:   Prior Outpatient Therapy:    Past Medical History:  Past Medical History:  Diagnosis Date  . Anxiety   . Bipolar 1 disorder ([place]   . Depression   . Endometriosis   . [place] of alcoholism ([place]   . Hyperlipidemia   . Hypothyroidism   . Myocardial infarction ([place]   . Obstructive sleep apnea   . Seizures ([place]     Past Surgical History:  Procedure Laterality Date  . ABDOMINAL SURGERY     laproscopic sx for endometriosis  . NASAL SINUS SURGERY  Family History:  Family History  Problem Relation Age of Onset  . Alcohol abuse Mother   . Hypertension Mother   . Hyperlipidemia Mother   . Stroke Mother   . Heart disease Mother   . Alcohol abuse Father   . Heart disease Maternal Aunt   . Cancer Maternal Uncle   . Heart disease Maternal Grandmother    Family Psychiatric  History: depression .  Social History:  Social History   Substance and Sexual Activity  Alcohol Use No     Social History   Substance and Sexual Activity  Drug Use No    Social  History   Socioeconomic History  . Marital status: Single    Spouse name: None  . Number of children: None  . Years of education: None  . Highest education level: None  Social Needs  . Financial resource strain: None  . Food insecurity - worry: None  . Food insecurity - inability: None  . Transportation needs - medical: None  . Transportation needs - non-medical: None  Occupational History  . None  Tobacco Use  . Smoking status: Never Smoker  . Smokeless tobacco: Never Used  Substance and Sexual Activity  . Alcohol use: No  . Drug use: No  . Sexual activity: Yes    Comment: same sex relationship  Other Topics Concern  . None  Social History Narrative  . None   Additional Social History:    Allergies:   Allergies  Allergen Reactions  . Bee Venom Shortness Of Breath and Rash    Welts  . Corn-Containing Products Anaphylaxis    And headaches  . Pork-Derived Products Anaphylaxis    And headaches  . Latex Itching, Rash and Other (See Comments)    (And) feels like her "body is on fire"  . Tape Itching and Rash    Labs:  Results for orders placed or performed during the hospital encounter of 12/23/16 (from the past 48 hour(s))  CBC with Differential/Platelet     Status: Abnormal   Collection Time: 12/23/16  3:57 PM  Result Value Ref Range   WBC 9.7 4.0 - 10.5 K/uL   RBC 4.51 3.87 - 5.11 MIL/uL   Hemoglobin 14.0 12.0 - 15.0 g/dL   HCT 42.3 36.0 - 46.0 %   MCV 93.8 78.0 - 100.0 fL   MCH 31.0 26.0 - 34.0 pg   MCHC 33.1 30.0 - 36.0 g/dL   RDW 13.3 11.5 - 15.5 %   Platelets 315 150 - 400 K/uL   Neutrophils Relative % 67 %   Neutro Abs 6.5 1.7 - 7.7 K/uL   Lymphocytes Relative 20 %   Lymphs Abs 1.9 0.7 - 4.0 K/uL   Monocytes Relative 11 %   Monocytes Absolute 1.1 (H) 0.1 - 1.0 K/uL   Eosinophils Relative 2 %   Eosinophils Absolute 0.2 0.0 - 0.7 K/uL   Basophils Relative 0 %   Basophils Absolute 0.0 0.0 - 0.1 K/uL  Comprehensive metabolic panel     Status:  Abnormal   Collection Time: 12/23/16  3:57 PM  Result Value Ref Range   Sodium 138 135 - 145 mmol/L   Potassium 5.5 (H) 3.5 - 5.1 mmol/L   Chloride 104 101 - 111 mmol/L   CO2 23 22 - 32 mmol/L   Glucose, Bld 85 65 - 99 mg/dL   BUN 14 6 - 20 mg/dL   Creatinine, Ser 0.85 0.44 - 1.00 mg/dL   Calcium 8.9 8.9 - 10.3  mg/dL   Total Protein 6.4 (L) 6.5 - 8.1 g/dL   Albumin 3.6 3.5 - 5.0 g/dL   AST 45 (H) 15 - 41 U/L   ALT 50 14 - 54 U/L   Alkaline Phosphatase 90 38 - 126 U/L   Total Bilirubin 1.1 0.3 - 1.2 mg/dL   GFR calc non Af Amer >60 >60 mL/min   GFR calc Af Amer >60 >60 mL/min    Comment: (NOTE) The eGFR has been calculated using the CKD EPI equation. This calculation has not been validated in all clinical situations. eGFR's persistently <60 mL/min signify possible Chronic Kidney Disease.    Anion gap 11 5 - 15  Valproic acid level     Status: Abnormal   Collection Time: 12/23/16  3:57 PM  Result Value Ref Range   Valproic Acid Lvl 36 (L) 50.0 - 100.0 ug/mL  Phenytoin level, total     Status: Abnormal   Collection Time: 12/23/16  3:57 PM  Result Value Ref Range   Phenytoin Lvl 5.3 (L) 10.0 - 20.0 ug/mL  Ethanol     Status: None   Collection Time: 12/23/16  3:58 PM  Result Value Ref Range   Alcohol, Ethyl (B) <10 <10 mg/dL  Rapid urine drug screen (hospital performed)     Status: None   Collection Time: 12/23/16  5:25 PM  Result Value Ref Range   Opiates NONE DETECTED NONE DETECTED   Cocaine NONE DETECTED NONE DETECTED   Benzodiazepines NONE DETECTED NONE DETECTED   Amphetamines NONE DETECTED NONE DETECTED   Tetrahydrocannabinol NONE DETECTED NONE DETECTED   Barbiturates NONE DETECTED NONE DETECTED    Comment:        DRUG SCREEN FOR MEDICAL PURPOSES ONLY.  IF CONFIRMATION IS NEEDED FOR ANY PURPOSE, NOTIFY LAB WITHIN 5 DAYS.        LOWEST DETECTABLE LIMITS FOR URINE DRUG SCREEN Drug Class       Cutoff (ng/mL) Amphetamine      1000 Barbiturate       200 Benzodiazepine   818 Tricyclics       563 Opiates          300 Cocaine          300 THC              50   HIV antibody (Routine Testing)     Status: None   Collection Time: 12/23/16  9:16 PM  Result Value Ref Range   HIV Screen 4th Generation wRfx Non Reactive Non Reactive    Comment: (NOTE) Performed At: [facility] [place], [place] [id number] Rush Farmer MD [id number]   CBC     Status: None   Collection Time: 12/23/16  9:16 PM  Result Value Ref Range   WBC 9.5 4.0 - 10.5 K/uL   RBC 4.23 3.87 - 5.11 MIL/uL   Hemoglobin 13.1 12.0 - 15.0 g/dL   HCT 39.5 36.0 - 46.0 %   MCV 93.4 78.0 - 100.0 fL   MCH 31.0 26.0 - 34.0 pg   MCHC 33.2 30.0 - 36.0 g/dL   RDW 13.3 11.5 - 15.5 %   Platelets 291 150 - 400 K/uL  Creatinine, serum     Status: None   Collection Time: 12/23/16  9:16 PM  Result Value Ref Range   Creatinine, Ser 0.79 0.44 - 1.00 mg/dL   GFR calc non Af Amer >60 >60 mL/min   GFR calc Af Amer >60 >60 mL/min  Comment: (NOTE) The eGFR has been calculated using the CKD EPI equation. This calculation has not been validated in all clinical situations. eGFR's persistently <60 mL/min signify possible Chronic Kidney Disease.   Basic metabolic panel     Status: Abnormal   Collection Time: 12/24/16  5:18 AM  Result Value Ref Range   Sodium 141 135 - 145 mmol/L   Potassium 4.0 3.5 - 5.1 mmol/L   Chloride 105 101 - 111 mmol/L   CO2 27 22 - 32 mmol/L   Glucose, Bld 121 (H) 65 - 99 mg/dL   BUN 9 6 - 20 mg/dL   Creatinine, Ser 0.70 0.44 - 1.00 mg/dL   Calcium 8.4 (L) 8.9 - 10.3 mg/dL   GFR calc non Af Amer >60 >60 mL/min   GFR calc Af Amer >60 >60 mL/min    Comment: (NOTE) The eGFR has been calculated using the CKD EPI equation. This calculation has not been validated in all clinical situations. eGFR's persistently <60 mL/min signify possible Chronic Kidney Disease.    Anion gap 9 5 - 15    Current Facility-Administered Medications   Medication Dose Route Frequency Provider Last Rate Last Dose  . 0.9 %  sodium chloride infusion  250 mL Intravenous PRN Charlynne Cousins, MD      . acetaminophen (TYLENOL) tablet 650 mg  650 mg Oral Q6H PRN Charlynne Cousins, MD   650 mg at 12/25/16 1042   Or  . acetaminophen (TYLENOL) suppository 650 mg  650 mg Rectal Q6H PRN Charlynne Cousins, MD      . aspirin EC tablet 81 mg  81 mg Oral Daily Charlynne Cousins, MD   81 mg at 12/25/16 1041  . atorvastatin (LIPITOR) tablet 20 mg  20 mg Oral QHS Charlynne Cousins, MD   20 mg at 12/24/16 2103  . divalproex (DEPAKOTE) DR tablet 250 mg  250 mg Oral Q8H Arrien, Jimmy Picket, MD   250 mg at 12/25/16 [phone]  . escitalopram (LEXAPRO) tablet 20 mg  20 mg Oral q morning - 10a Charlynne Cousins, MD   20 mg at 12/25/16 1041  . levothyroxine (SYNTHROID, LEVOTHROID) tablet 50 mcg  50 mcg Oral QAC breakfast Charlynne Cousins, MD   50 mcg at 12/25/16 1041  . nitroGLYCERIN (NITROSTAT) SL tablet 0.4 mg  0.4 mg Sublingual Q5 min PRN Charlynne Cousins, MD      . ondansetron [facility]) tablet 4 mg  4 mg Oral Q6H PRN Charlynne Cousins, MD       Or  . ondansetron [facility]) injection 4 mg  4 mg Intravenous Q6H PRN Charlynne Cousins, MD   4 mg at 12/24/16 1732  . phenytoin (DILANTIN) ER capsule 100 mg  100 mg Oral TID Tawni Millers, MD   100 mg at 12/25/16 1041  . polyethylene glycol (MIRALAX / GLYCOLAX) packet 17 g  17 g Oral Daily PRN Charlynne Cousins, MD      . sodium chloride flush (NS) 0.9 % injection 3 mL  3 mL Intravenous Q12H Charlynne Cousins, MD   3 mL at 12/25/16 1043  . sodium chloride flush (NS) 0.9 % injection 3 mL  3 mL Intravenous PRN Charlynne Cousins, MD        Musculoskeletal: Strength & Muscle Tone: within normal limits Gait & Station: in bed   Psychiatric Specialty Exam: Physical Exam  Review of Systems  Cardiovascular: Negative for chest pain.  Psychiatric/Behavioral: Positive for  depression. Negative for suicidal ideas.    Blood pressure (!) 111/48, pulse 93, temperature 98.2 F (36.8 C), temperature source Oral, resp. rate 17, height 5' 8"  (1.727 m), weight 102.6 kg (226 lb 4.8 oz), SpO2 98 %.Body mass index is 34.41 kg/m.  General Appearance: Casual  Eye Contact:  Fair  Speech:  Slow  Volume:  Decreased  Mood:  Dysphoric  Affect:  Congruent  Thought Process:  Goal Directed  Orientation:  Full (Time, Place, and Person)  Thought Content:  Rumination  Suicidal Thoughts:  No  Homicidal Thoughts:  No  Memory:  Immediate;   Fair Recent;   Fair  Judgement:  Intact  Insight:  Shallow  Psychomotor Activity:  Decreased  Concentration:  Concentration: Fair and Attention Span: Fair  Recall:  [employer] of Knowledge:  Fair  Language:  Fair  Akathisia:  Negative  Handed:    AIMS (if indicated):     Assets:  Desire for Improvement  ADL's:  Intact  Cognition:  WNL  Sleep:        Treatment Plan Summary: Medication management  Disposition: No evidence of imminent risk to self or others at present.   Supportive therapy provided about ongoing stressors. Discussed crisis plan, support from social network, calling 911, coming to the Emergency Department, and calling Suicide Hotline.   Have reviewed possibility of anxiety and depression to non epileptic or adding to her current medical condition as well Would refer for outpatient services . Please make appointment before discharge by calling [phone] No drug abuse or denies Can continue lexapro 60m or increase to 355mfor depression and anxiety, also consider increasing depakote by [id number] cover for mood as well  Thanks for the consult. Would sign out and [facility]   NadMerian CapronD 12/25/2016 12:21 PM

## 2016-12-26 DIAGNOSIS — F445 Conversion disorder with seizures or convulsions: Secondary | ICD-10-CM

## 2016-12-26 DIAGNOSIS — G4733 Obstructive sleep apnea (adult) (pediatric): Secondary | ICD-10-CM

## 2016-12-26 LAB — TSH: TSH: 2.236 u[IU]/mL (ref 0.350–4.500)

## 2016-12-26 MED ORDER — KETOROLAC TROMETHAMINE 15 MG/ML IJ SOLN
15.0000 mg | Freq: Three times a day (TID) | INTRAMUSCULAR | Status: DC | PRN
  Administered 2016-12-26: 15 mg via INTRAVENOUS
  Filled 2016-12-26: qty 1

## 2016-12-26 MED ORDER — LORAZEPAM 2 MG/ML IJ SOLN
INTRAMUSCULAR | Status: AC
  Administered 2016-12-26: 1 mg via INTRAVENOUS
  Filled 2016-12-26: qty 1

## 2016-12-26 MED ORDER — LORAZEPAM 2 MG/ML IJ SOLN
1.0000 mg | Freq: Once | INTRAMUSCULAR | Status: AC
Start: 2016-12-26 — End: 2016-12-26
  Administered 2016-12-26: 1 mg via INTRAVENOUS

## 2016-12-26 MED ORDER — SODIUM CHLORIDE 0.9 % IV SOLN: 500.0000 mg | Freq: Two times a day (BID) | INTRAVENOUS | Status: DC

## 2016-12-26 NOTE — Progress Notes (Signed)
Patients bed alarm went off. RN arrived to meet pt at the doorway and assist her back to bed. Pt then had seizure lasting 5-7 mins. Staff kept pt safe in bed. MD notified.  Will continue to monitor.

## 2016-12-26 NOTE — Evaluation (Signed)
Physical Therapy Evaluation Patient Details Name: Krista Young MRN: [id number] DOB: Apr 23, 1965 Today's Date: 12/26/2016   History of Present Illness  51 year old female who presented with seizure. Patient does have a significant past medical history of epilepsy. She presented with a generalized seizure at home, and multiple seizures that her arrival to the emergency department. EEG results do not indicate epileptic seizure activity. Medications are currently being adjusted   Clinical Impression  Pt admitted with above diagnosis. Pt currently with functional limitations due to the deficits listed below (see PT Problem List). Pt is limited in her safe mobility by post ictal headache, and photo sensitivity and decreased balance. Pt is currently mod I for bed mobilit, and min guard for transfers and ambulation of 60 feet with RW. Pt will benefit from skilled PT to increase their independence and safety with mobility to allow discharge to the venue listed below.          Follow Up Recommendations No PT follow up;Supervision/Assistance - 24 hour    Equipment Recommendations  Rolling walker with 5" wheels    Recommendations for Other Services       Precautions / Restrictions Precautions Precautions: Fall Restrictions Weight Bearing Restrictions: No      Mobility  Bed Mobility Overal bed mobility: Modified Independent             General bed mobility comments: HoB elevated  Transfers Overall transfer level: Needs assistance Equipment used: None Transfers: Sit to/from Stand Sit to Stand: Min guard         General transfer comment: min guard for safety increased headache pain and diziness with standing  Ambulation/Gait Ambulation/Gait assistance: Min guard Ambulation Distance (Feet): 60 Feet Assistive device: Rolling walker (2 wheeled) Gait Pattern/deviations: Step-through pattern;Decreased step length - right;Decreased step length - left;Drifts right/left Gait  velocity: slowed Gait velocity interpretation: Below normal speed for age/gender General Gait Details: hands on min guard for safety, pt took initial steps without RW assist but held onto sink and wall, once she had the RW gait became steadier however once out in hallway with increased level of light and sound pt with decreased eye opening and drifting back and forth with gait. Pt reports headache and associated dizziness are getting worse and returned to room.       Balance Overall balance assessment: Needs assistance Sitting-balance support: No upper extremity supported;Feet supported Sitting balance-Leahy Scale: Good     Standing balance support: No upper extremity supported;During functional activity;Bilateral upper extremity supported Standing balance-Leahy Scale: Fair Standing balance comment: able to static stand without support but dynamic balance requires UE support                             Pertinent Vitals/Pain Pain Assessment: 0-10 Pain Score: 9  Pain Location: headache Pain Descriptors / Indicators: Headache Pain Intervention(s): Limited activity within patient's tolerance;Monitored during session    Home Living Family/patient expects to be discharged to:: Private residence Living Arrangements: Alone Available Help at Discharge: Friend(s);Available PRN/intermittently Type of Home: House Home Access: Stairs to enter Entrance Stairs-Rails: None Entrance Stairs-Number of Steps: 1 Home Layout: One level Home Equipment: Grab bars - tub/shower      Prior Function Level of Independence: Independent         Comments: no longer driving, ambulation affected by seizures, waits for someone to be present to take a shower     Hand Dominance   Dominant Hand: Right  Extremity/Trunk Assessment   Upper Extremity Assessment Upper Extremity Assessment: Overall WFL for tasks assessed    Lower Extremity Assessment Lower Extremity Assessment: Overall WFL  for tasks assessed       Communication   Communication: No difficulties  Cognition Arousal/Alertness: Awake/alert Behavior During Therapy: Flat affect Overall Cognitive Status: Within Functional Limits for tasks assessed                                        General Comments General comments (skin integrity, edema, etc.): Pt limited in her abilities by severe headache and photo sensitivitey        Assessment/Plan    PT Assessment Patient needs continued PT services  PT Problem List Decreased activity tolerance;Pain;Decreased balance       PT Treatment Interventions DME instruction;Gait training;Functional mobility training;Therapeutic activities;Therapeutic exercise;Balance training;Patient/family education    PT Goals (Current goals can be found in the Care Plan section)  Acute Rehab PT Goals Patient Stated Goal: go home PT Goal Formulation: With patient Time For Goal Achievement: 01/09/17 Potential to Achieve Goals: Fair    Frequency Min 3X/week   Barriers to discharge Decreased caregiver support         AM-PAC PT "6 Clicks" Daily Activity  Outcome Measure Difficulty turning over in bed (including adjusting bedclothes, sheets and blankets)?: A Little Difficulty moving from lying on back to sitting on the side of the bed? : A Lot Difficulty sitting down on and standing up from a chair with arms (e.g., wheelchair, bedside commode, etc,.)?: Unable Help needed moving to and from a bed to chair (including a wheelchair)?: A Little Help needed walking in hospital room?: A Little Help needed climbing 3-5 steps with a railing? : A Lot 6 Click Score: 14    End of Session Equipment Utilized During Treatment: Gait belt Activity Tolerance: Patient limited by pain Patient left: in bed;with call bell/phone within reach;with bed alarm set Nurse Communication: Mobility status;Patient requests pain meds PT Visit Diagnosis: Unsteadiness on feet (R26.81);Other  abnormalities of gait and mobility (R26.89);History of falling (Z91.81);Difficulty in walking, not elsewhere classified (R26.2);Dizziness and giddiness (R42);Pain Pain - part of body: (headache)    Time: 1055-1140 PT Time Calculation (min) (ACUTE ONLY): 45 min   Charges:   PT Evaluation $PT Eval Moderate Complexity: 1 Mod PT Treatments $Gait Training: 8-22 mins $Therapeutic Activity: 8-22 mins   PT G Codes:        Ayyan Sites B. Beverely RisenVan Fleet PT, DPT Acute Rehabilitation  [phone] Pager [phone]    Elon Alaslizabeth B Van Fleet 12/26/2016, 12:06 PM

## 2016-12-26 NOTE — Progress Notes (Addendum)
PROGRESS NOTE    Krista Young  RUE:[id number] DOB: [birth year] DOA: 12/23/2016 PCP: De BlanchFawcett-Hardy, June Anne, FNP    Brief Narrative:  51 year old female who presented with seizure. Patient does have a significant past medical history of epilepsy. She presented with a generalized seizure at home, and multiple seizures that her arrival to the emergency department. Apparently her antiepileptic medications have been progressively modified in the outpatient setting, change from phenytoin to valproic acid. On initial physical examination blood pressure 110/75, heart rate 111, respiratory rate 23, oxygen saturation 96%. Moist mucous membranes, lungs clear to auscultation bilaterally, no wheezing rales or rhonchi, heart S1-S2 present rhythmic, abdomen soft nontender, no lower extremity edema. Sodium 138, potassium 5.5, chloride 104, bicarbonate 23, glucose 85, B at 14, creatinine 0.85, AST 45, AST 50, white count 8.7, hemoglobin 14.0 hematocrit 42.3, platelets 315. Phenytoin level 5.3 (low), valproic acid 36 (low). Urine drug screen negative. Head and neck CT with no acute findings.  Patient was admitted to the hospital working diagnosis of uncontrolled seizures.   Assessment & Plan:   Active Problems:   BIPOLAR AFFECTIVE DISORDER   SLEEP APNEA, OBSTRUCTIVE   Seizure (HCC)   Hyperkalemia    1. Change in mental status. Patient had another episode of acute change in mentation, will discontinue depakote and will continue phenytoin per neurology recommendations. Continue neuro checks.   2. Bipolar. Patient seen by psychiatry, will continue lexapro 20 mg, will need outpatient follow up. Anxiety and depression, component to her current decompensation.   3. Sleep apnea.  Outpatient follow up is recommended.   4. Hyperkalemia. Will check renal panel in am, patient is tolerating po well.   5. Hypothyroid. Patient has been off levothyroxine, TSH is within normal ranges, patient has no  hypothyroid symptoms, will dc levothyroxine.   DVT prophylaxis: enoxaparin  Code Status:  full Family Communication:  Disposition Plan:  Home   Consultants:   Neurology   Procedures:     Antimicrobials:      Subjective: Patient had another episode this am of altered mentation, self resolved, no confusion or agitation. No nausea or vomiting, noted decreased balance while ambulating.   Objective: Vitals:   12/25/16 0619 12/25/16 1441 12/25/16 2155 12/26/16 0638  BP: (!) 111/48 112/80 109/61 (!) 131/54  Pulse: 93 93 87 86  Resp: 17 17 12 16   Temp: 98.2 F (36.8 C) 97.8 F (36.6 C) 98.1 F (36.7 C) 98.2 F (36.8 C)  TempSrc: Oral Oral Oral Oral  SpO2: 98% 97% 96% 95%  Weight:      Height:       No intake or output data in the 24 hours ending 12/26/16 0959 Filed Weights   12/24/16 0640  Weight: 102.6 kg (226 lb 4.8 oz)    Examination:   General: Not in pain or dyspnea, deconditioned Neurology: Awake and alert, non focal  E ENT: mild pallor, no icterus, oral mucosa moist Cardiovascular: No JVD. S1-S2 present, rhythmic, no gallops, rubs, or murmurs. No lower extremity edema. Pulmonary: vesicular breath sounds bilaterally, adequate air movement, no wheezing, rhonchi or rales. Gastrointestinal. Abdomen protuberant, no organomegaly, non tender, no rebound or guarding Skin. No rashes Musculoskeletal: no joint deformities     Data Reviewed: I have personally reviewed following labs and imaging studies  CBC: Recent Labs  Lab 12/23/16 1557 12/23/16 2116  WBC 9.7 9.5  NEUTROABS 6.5  --   HGB 14.0 13.1  HCT 42.3 39.5  MCV 93.8 93.4  PLT 315 291  Basic Metabolic Panel: Recent Labs  Lab 12/23/16 1557 12/23/16 2116 12/24/16 0518  NA 138  --  141  K 5.5*  --  4.0  CL 104  --  105  CO2 23  --  27  GLUCOSE 85  --  121*  BUN 14  --  9  CREATININE 0.85 0.79 0.70  CALCIUM 8.9  --  8.4*   GFR: Estimated Creatinine Clearance: 106.6 mL/min (by C-G  formula based on SCr of 0.7 mg/dL). Liver Function Tests: Recent Labs  Lab 12/23/16 1557 12/25/16 2158  AST 45* 22  ALT 50 29  ALKPHOS 90 77  BILITOT 1.1 0.3  PROT 6.4* 6.0*  ALBUMIN 3.6 3.3*   No results for input(s): LIPASE, AMYLASE in the last 168 hours. Recent Labs  Lab 12/25/16 2158  AMMONIA 56*   Coagulation Profile: No results for input(s): INR, PROTIME in the last 168 hours. Cardiac Enzymes: No results for input(s): CKTOTAL, CKMB, CKMBINDEX, TROPONINI in the last 168 hours. BNP (last 3 results) No results for input(s): PROBNP in the last 8760 hours. HbA1C: No results for input(s): HGBA1C in the last 72 hours. CBG: No results for input(s): GLUCAP in the last 168 hours. Lipid Profile: No results for input(s): CHOL, HDL, LDLCALC, TRIG, CHOLHDL, LDLDIRECT in the last 72 hours. Thyroid Function Tests: No results for input(s): TSH, T4TOTAL, FREET4, T3FREE, THYROIDAB in the last 72 hours. Anemia Panel: No results for input(s): VITAMINB12, FOLATE, FERRITIN, TIBC, IRON, RETICCTPCT in the last 72 hours.    Radiology Studies: I have reviewed all of the imaging during this hospital visit personally     Scheduled Meds: . aspirin EC  81 mg Oral Daily  . atorvastatin  20 mg Oral QHS  . escitalopram  20 mg Oral q morning - 10a  . levothyroxine  50 mcg Oral QAC breakfast  . phenytoin  100 mg Oral TID  . sodium chloride flush  3 mL Intravenous Q12H   Continuous Infusions: . sodium chloride       LOS: 3 days        Ridge Lafond Annett Gulaaniel Krystle Oberman, MD [facility] Pager [phone]

## 2016-12-26 NOTE — Progress Notes (Signed)
PROGRESS NOTE    Krista Young  UEA:[id number] DOB: 1965/02/12 DOA: 12/23/2016 PCP: De BlanchFawcett-Hardy, June Anne, FNP  Brief Narrative: 51 year old female who presented with seizure. Patient does have a significant past medical history of epilepsy. She presented with a generalized seizure at home, and multiple seizures that her arrival to the emergency department. Apparently her antiepileptic medications have been progressively modified in the outpatient setting, change from phenytoin to valproic acid. On initial physical examination blood pressure 110/75, heart rate 111, respiratory rate 23, oxygen saturation 96%. Moist mucous membranes, lungs clear to auscultation bilaterally, no wheezing rales or rhonchi, heart S1-S2 present rhythmic, abdomen soft nontender, no lower extremity edema. Sodium 138, potassium 5.5, chloride 104, bicarbonate 23, glucose 85, B at 14, creatinine 0.85, AST 45, AST 50, white count 8.7, hemoglobin 14.0 hematocrit 42.3, platelets 315. Phenytoin level 5.3 (low), valproic acid 36 (low). Urine drug screen negative. Head and neck CT with no acute findings.  Patient was admitted to the hospital working diagnosis of uncontrolled seizures.     Assessment & Plan:   Active Problems:   BIPOLAR AFFECTIVE DISORDER   SLEEP APNEA, OBSTRUCTIVE   Seizure (HCC)   Hyperkalemia  Pseudo seizure Bipolar  Plan dw neuro.obtain psych eval to see if any med alteration can be done to keep her more calm.she is apparently very violent during these non epileptic seizures.follow psych recs.   DVT prophylaxis:lovenox Code Status: full  Family Communication:she hasno family Disposition Plan: dc home this week. Consultants: neuro   Procedures:none Antimicrobials:  none Subjective:complains of head ache.lives alone now.but moving in with a friend in one week.   Objective:very tearfull.. Vitals:   12/25/16 0619 12/25/16 1441 12/25/16 2155 12/26/16 0638  BP: (!) 111/48 112/80  109/61 (!) 131/54  Pulse: 93 93 87 86  Resp: 17 17 12 16   Temp: 98.2 F (36.8 C) 97.8 F (36.6 C) 98.1 F (36.7 C) 98.2 F (36.8 C)  TempSrc: Oral Oral Oral Oral  SpO2: 98% 97% 96% 95%  Weight:      Height:       No intake or output data in the 24 hours ending 12/26/16 1000 Filed Weights   12/24/16 0640  Weight: 102.6 kg (226 lb 4.8 oz)    Examination:  General exam: Appears calm and comfortable  Respiratory system: Clear to auscultation. Respiratory effort normal. Cardiovascular system: S1 & S2 heard, RRR. No JVD, murmurs, rubs, gallops or clicks. No pedal edema. Gastrointestinal system: Abdomen is nondistended, soft and nontender. No organomegaly or masses felt. Normal bowel sounds heard. Central nervous system: Alert and oriented. No focal neurological deficits. Extremities: Symmetric 5 x 5 power. Skin: No rashes, lesions or ulcers Psychiatry: Judgement and insight appear normal. Mood & affect appropriate.     Data Reviewed: I have personally reviewed following labs and imaging studies  CBC: Recent Labs  Lab 12/23/16 1557 12/23/16 2116  WBC 9.7 9.5  NEUTROABS 6.5  --   HGB 14.0 13.1  HCT 42.3 39.5  MCV 93.8 93.4  PLT 315 291   Basic Metabolic Panel: Recent Labs  Lab 12/23/16 1557 12/23/16 2116 12/24/16 0518  NA 138  --  141  K 5.5*  --  4.0  CL 104  --  105  CO2 23  --  27  GLUCOSE 85  --  121*  BUN 14  --  9  CREATININE 0.85 0.79 0.70  CALCIUM 8.9  --  8.4*   GFR: Estimated Creatinine Clearance: 106.6 mL/min (by C-G formula  based on SCr of 0.7 mg/dL). Liver Function Tests: Recent Labs  Lab 12/23/16 1557 12/25/16 2158  AST 45* 22  ALT 50 29  ALKPHOS 90 77  BILITOT 1.1 0.3  PROT 6.4* 6.0*  ALBUMIN 3.6 3.3*   No results for input(s): LIPASE, AMYLASE in the last 168 hours. Recent Labs  Lab 12/25/16 2158  AMMONIA 56*   Coagulation Profile: No results for input(s): INR, PROTIME in the last 168 hours. Cardiac Enzymes: No results for  input(s): CKTOTAL, CKMB, CKMBINDEX, TROPONINI in the last 168 hours. BNP (last 3 results) No results for input(s): PROBNP in the last 8760 hours. HbA1C: No results for input(s): HGBA1C in the last 72 hours. CBG: No results for input(s): GLUCAP in the last 168 hours. Lipid Profile: No results for input(s): CHOL, HDL, LDLCALC, TRIG, CHOLHDL, LDLDIRECT in the last 72 hours. Thyroid Function Tests: No results for input(s): TSH, T4TOTAL, FREET4, T3FREE, THYROIDAB in the last 72 hours. Anemia Panel: No results for input(s): VITAMINB12, FOLATE, FERRITIN, TIBC, IRON, RETICCTPCT in the last 72 hours. Sepsis Labs: No results for input(s): PROCALCITON, LATICACIDVEN in the last 168 hours.  No results found for this or any previous visit (from the past 240 hour(s)).       Radiology Studies: No results found.      Scheduled Meds: . aspirin EC  81 mg Oral Daily  . atorvastatin  20 mg Oral QHS  . escitalopram  20 mg Oral q morning - 10a  . levothyroxine  50 mcg Oral QAC breakfast  . phenytoin  100 mg Oral TID  . sodium chloride flush  3 mL Intravenous Q12H   Continuous Infusions: . sodium chloride       LOS: 3 days        Alwyn RenElizabeth G Mathews, MD [facility] If 7PM-7AM, please contact night-coverage [email or web address] Password [email or web address] 12/26/2016, 10:00 AM

## 2016-12-26 NOTE — Progress Notes (Signed)
Ammonia elevated. Discussed with Dr. Rueben BashKakrandy. Depakote should be discontinued for three reasons: Spells captured this admission on EEG were all non-epileptic, Depakote and Dilantin taken together are a poor and possibly unsafe combination due to complex interaction of enzyme induction and inhibition effects, ammonia level elevated.   Continue Dilantin now and at discharge. Obtain repeat level prior to discharge as level may change after valproic acid discontinued. Should be followed up as an outpatient by Neurology for consideration of a gradual Dilantin taper. Discussed with Dr. Toniann FailKakrakandy.   Electronically signed: Dr. Caryl PinaEric Kerria Sapien

## 2016-12-27 LAB — BASIC METABOLIC PANEL
ANION GAP: 7 (ref 5–15)
BUN: 16 mg/dL (ref 6–20)
CALCIUM: 8.8 mg/dL — AB (ref 8.9–10.3)
CO2: 28 mmol/L (ref 22–32)
Chloride: 104 mmol/L (ref 101–111)
Creatinine, Ser: 0.73 mg/dL (ref 0.44–1.00)
Glucose, Bld: 89 mg/dL (ref 65–99)
POTASSIUM: 4.1 mmol/L (ref 3.5–5.1)
Sodium: 139 mmol/L (ref 135–145)

## 2016-12-27 LAB — CBC WITH DIFFERENTIAL/PLATELET
BASOS ABS: 0 10*3/uL (ref 0.0–0.1)
BASOS PCT: 0 %
Eosinophils Absolute: 0.3 10*3/uL (ref 0.0–0.7)
Eosinophils Relative: 5 %
HEMATOCRIT: 39.7 % (ref 36.0–46.0)
Hemoglobin: 13.2 g/dL (ref 12.0–15.0)
LYMPHS PCT: 31 %
Lymphs Abs: 2.2 10*3/uL (ref 0.7–4.0)
MCH: 31.1 pg (ref 26.0–34.0)
MCHC: 33.2 g/dL (ref 30.0–36.0)
MCV: 93.6 fL (ref 78.0–100.0)
MONO ABS: 0.8 10*3/uL (ref 0.1–1.0)
Monocytes Relative: 11 %
NEUTROS ABS: 3.7 10*3/uL (ref 1.7–7.7)
Neutrophils Relative %: 53 %
PLATELETS: 293 10*3/uL (ref 150–400)
RBC: 4.24 MIL/uL (ref 3.87–5.11)
RDW: 13.4 % (ref 11.5–15.5)
WBC: 6.9 10*3/uL (ref 4.0–10.5)

## 2016-12-27 LAB — PHENYTOIN LEVEL, TOTAL: PHENYTOIN LVL: 10.7 ug/mL (ref 10.0–20.0)

## 2016-12-27 MED ORDER — ESCITALOPRAM OXALATE 20 MG PO TABS: 20.0000 mg | ORAL_TABLET | Freq: Every morning | ORAL | 0 refills | Status: DC

## 2016-12-27 NOTE — Progress Notes (Signed)
Krista Young to be D/C'd Home per MD order.  Discussed with the patient and all questions fully answered.  VSS, Skin clean, dry and intact without evidence of skin break down, Pt does have two abrasions on her face. IV catheter discontinued intact. Site without signs and symptoms of complications. Dressing and pressure applied.  An After Visit Summary was printed and given to the patient. Patient received prescription.  D/c education completed with patient/family including follow up instructions, medication list, d/c activities limitations if indicated, with other d/c instructions as indicated by MD - patient able to verbalize understanding, all questions fully answered.   Patient instructed to return to ED, call 911, or call MD for any changes in condition.   Patient is waiting on her friend to come get her. Will continue to assess.  Grayling Congressvan J Wright Gravely 12/27/2016 11:17 AM

## 2016-12-27 NOTE — Discharge Summary (Signed)
Physician Discharge Summary  Harriett SineCatherine J Herald EAV:[id number] DOB: 1965/06/28 DOA: 12/23/2016  PCP: De BlanchFawcett-Hardy, June Anne, FNP  Admit date: 12/23/2016 Discharge date: 12/27/2016  Admitted From: Home Disposition:  Home  Recommendations for Outpatient Follow-up:  1. Follow up with PCP in 1- week. 2. Valproic acid has been discontinued 3. Consideration for gradual Phenytoin taper as outpatient.  Home Health: No  Equipment/Devices: No   Discharge Condition: Stable CODE STATUS: full  Diet recommendation: Regular  Brief/Interim Summary: 51 year old female who presented with a suspected seizure. Patient does have a significant past medical history of suspected seizures. She presented with a suspected generalized seizure at home,and multiple episodes at her arrival to the emergency department.Apparently her antiepileptic medications have been progressively modified in the outpatient setting, change phenytoin /valproic acid.On initial physical examination blood pressure 110/75, heart rate 111, respiratoryrate23, oxygen saturation 96%.Patient was awake and alert, non focal, moist mucous membranes, lungs clear to auscultation bilaterally, no wheezing rales or rhonchi, heart S1-S2 present rhythmic, abdomen soft nontender, no lower extremity edema. Sodium 138, potassium 5.5, chloride 104, bicarbonate 23, glucose 85, BUN14, creatinine 0.85,AST 45, ALT 50,white count 8.7, hemoglobin 14.0 hematocrit 42.3, platelets 315.Phenytoin level5.3(low), valproic acid 36(low).Urine drug screen negative.Head and neck CT with no acute findings.  Patient was admitted to the hospital working diagnosis of suspected uncontrolled seizures.  1. Acute encephalopathy, change in mental status. Patient was admitted to the medical ward, she was placed on a remote telemetry monitor, she had 2 episodes of acute change in her mentation during her hospitalization. She was seen by neurology, old records were  reviewed, apparently patient has been ruled out for seizures on prior hospitalizations, prior electroencephalography have rule out epileptic activity. It was recommended to discontinue valproic acid, continue phenytoin, with recommendations to slowly taper as an outpatient. She was seen by psychiatry, concluded that anxiety and depression likely affecting or contribution to her acute episodes of change in mental status. It was recommended to continue Lexapro 20 mg daily and adjust as an outpatient. No evidence of imminent risk to self or others. She has been 24 hours without further events, it has been decided to discharge her home and have follow-up as an outpatient.  2. Bipolar disorder. Patient was seen by psychiatry, recommendations to continue Lexapro.  3. History of hypothyroidism. Patient has been on levothyroxine per her doctor recommendations, TSH was found to be normal, levothyroxine will be continued to be held.  4. Hyperkalemia. Patient received supportive medical care, improvement of potassium, discharge potassium 4.1 with preserved renal function.  5. Obstructive sleep apnea with obesity. BMI 34, will recommend continue follow-up as an outpatient.    Discharge Diagnoses:  Active Problems:   BIPOLAR AFFECTIVE DISORDER   SLEEP APNEA, OBSTRUCTIVE   Seizure (HCC)   Hyperkalemia    Discharge Instructions   Allergies as of 12/27/2016      Reactions   Bee Venom Shortness Of Breath, Rash   Welts   Corn-containing Products Anaphylaxis   And headaches   Pork-derived Products Anaphylaxis   And headaches   Latex Itching, Rash, Other (See Comments)   (And) feels like her "body is on fire"   Tape Itching, Rash      Medication List    STOP taking these medications   citalopram 20 MG tablet Commonly known as:  CELEXA   divalproex 250 MG DR tablet Commonly known as:  DEPAKOTE   valproic acid 250 MG capsule Commonly known as:  DEPAKENE     TAKE these  medications    aspirin EC 81 MG tablet Take 81 mg by mouth daily.   cyclobenzaprine 5 MG tablet Commonly known as:  FLEXERIL Take 5 mg by mouth 3 (three) times daily as needed for muscle spasms.   escitalopram 20 MG tablet Commonly known as:  LEXAPRO Take 1 tablet (20 mg total) by mouth every morning.   nitroGLYCERIN 0.4 MG SL tablet Commonly known as:  NITROSTAT Place 0.4 mg under the tongue every 5 (five) minutes as needed for chest pain.   phenytoin 100 MG ER capsule Commonly known as:  DILANTIN Take 100 mg by mouth 3 (three) times daily.       Allergies  Allergen Reactions  . Bee Venom Shortness Of Breath and Rash    Welts  . Corn-Containing Products Anaphylaxis    And headaches  . Pork-Derived Products Anaphylaxis    And headaches  . Latex Itching, Rash and Other (See Comments)    (And) feels like her "body is on fire"  . Tape Itching and Rash    Consultations:  Neurology  Psychiatry   Procedures/Studies: Ct Head Wo Contrast  Result Date: 12/23/2016 CLINICAL DATA:  Unwitnessed seizure today. Fall with laceration to the left side of the face. EXAM: CT HEAD WITHOUT CONTRAST CT CERVICAL SPINE WITHOUT CONTRAST TECHNIQUE: Multidetector CT imaging of the head and cervical spine was performed following the standard protocol without intravenous contrast. Multiplanar CT image reconstructions of the cervical spine were also generated. COMPARISON:  10/27/2016 and multiple previous. FINDINGS: CT HEAD FINDINGS Brain: No evidence of malformation, atrophy, old or acute small or large vessel infarction, mass lesion, hemorrhage, hydrocephalus or extra-axial collection. No evidence of pituitary lesion. Vascular: No vascular calcification.  No hyperdense vessels. Skull: Normal.  No fracture or focal bone lesion. Sinuses/Orbits: Visualized sinuses are clear. No fluid in the middle ears or mastoids. Visualized orbits are normal. Other: None significant CT CERVICAL SPINE FINDINGS Alignment:  Straightening of the normal cervical lordosis, likely positional. Skull base and vertebrae: No fracture or focal lesion. Soft tissues and spinal canal: Negative Disc levels: Degenerative spondylosis with endplate osteophytes at C4-5, C5-6 and C6-7. Upper chest: Negative Other: None IMPRESSION: Head CT: Normal. No traumatic finding. No cause of seizure identified. Cervical spine CT: No acute or traumatic finding. Ordinary chronic cervical spondylosis. Electronically Signed   By: Paulina Fusi M.D.   On: 12/23/2016 17:03   Ct Cervical Spine Wo Contrast  Result Date: 12/23/2016 CLINICAL DATA:  Unwitnessed seizure today. Fall with laceration to the left side of the face. EXAM: CT HEAD WITHOUT CONTRAST CT CERVICAL SPINE WITHOUT CONTRAST TECHNIQUE: Multidetector CT imaging of the head and cervical spine was performed following the standard protocol without intravenous contrast. Multiplanar CT image reconstructions of the cervical spine were also generated. COMPARISON:  10/27/2016 and multiple previous. FINDINGS: CT HEAD FINDINGS Brain: No evidence of malformation, atrophy, old or acute small or large vessel infarction, mass lesion, hemorrhage, hydrocephalus or extra-axial collection. No evidence of pituitary lesion. Vascular: No vascular calcification.  No hyperdense vessels. Skull: Normal.  No fracture or focal bone lesion. Sinuses/Orbits: Visualized sinuses are clear. No fluid in the middle ears or mastoids. Visualized orbits are normal. Other: None significant CT CERVICAL SPINE FINDINGS Alignment: Straightening of the normal cervical lordosis, likely positional. Skull base and vertebrae: No fracture or focal lesion. Soft tissues and spinal canal: Negative Disc levels: Degenerative spondylosis with endplate osteophytes at C4-5, C5-6 and C6-7. Upper chest: Negative Other: None IMPRESSION: Head CT: Normal. No  traumatic finding. No cause of seizure identified. Cervical spine CT: No acute or traumatic finding. Ordinary  chronic cervical spondylosis. Electronically Signed   By: Paulina Fusi M.D.   On: 12/23/2016 17:03       Subjective: Patient feeling well, no further significant change in mentation, no nausea or vomiting, no chest pain or dyspnea.   Discharge Exam: Vitals:   12/26/16 2205 12/27/16 0511  BP: 125/70 140/85  Pulse: 83 73  Resp: 18 16  Temp: 98.7 F (37.1 C) 97.6 F (36.4 C)  SpO2: 98% 96%   Vitals:   12/26/16 0638 12/26/16 1500 12/26/16 2205 12/27/16 0511  BP: (!) 131/54 108/68 125/70 140/85  Pulse: 86 82 83 73  Resp: 16 17 18 16   Temp: 98.2 F (36.8 C) 98.5 F (36.9 C) 98.7 F (37.1 C) 97.6 F (36.4 C)  TempSrc: Oral Oral Oral Oral  SpO2: 95% 97% 98% 96%  Weight:      Height:        General: Pt is alert, awake, not in acute distress E ENT. Mild pallor, no icterus, oral mucosa moist.  Cardiovascular: RRR, S1/S2 +, no rubs, no gallops Respiratory: CTA bilaterally, no wheezing, no rhonchi Abdominal: Soft, NT, ND, bowel sounds + Extremities: no edema, no cyanosis    The results of significant diagnostics from this hospitalization (including imaging, microbiology, ancillary and laboratory) are listed below for reference.     Microbiology: No results found for this or any previous visit (from the past 240 hour(s)).   Labs: BNP (last 3 results) No results for input(s): BNP in the last 8760 hours. Basic Metabolic Panel: Recent Labs  Lab 12/23/16 1557 12/23/16 2116 12/24/16 0518 12/27/16 0248  NA 138  --  141 139  K 5.5*  --  4.0 4.1  CL 104  --  105 104  CO2 23  --  27 28  GLUCOSE 85  --  121* 89  BUN 14  --  9 16  CREATININE 0.85 0.79 0.70 0.73  CALCIUM 8.9  --  8.4* 8.8*   Liver Function Tests: Recent Labs  Lab 12/23/16 1557 12/25/16 2158  AST 45* 22  ALT 50 29  ALKPHOS 90 77  BILITOT 1.1 0.3  PROT 6.4* 6.0*  ALBUMIN 3.6 3.3*   No results for input(s): LIPASE, AMYLASE in the last 168 hours. Recent Labs  Lab 12/25/16 2158  AMMONIA 56*    CBC: Recent Labs  Lab 12/23/16 1557 12/23/16 2116 12/27/16 0248  WBC 9.7 9.5 6.9  NEUTROABS 6.5  --  3.7  HGB 14.0 13.1 13.2  HCT 42.3 39.5 39.7  MCV 93.8 93.4 93.6  PLT 315 291 293   Cardiac Enzymes: No results for input(s): CKTOTAL, CKMB, CKMBINDEX, TROPONINI in the last 168 hours. BNP: Invalid input(s): POCBNP CBG: No results for input(s): GLUCAP in the last 168 hours. D-Dimer No results for input(s): DDIMER in the last 72 hours. Hgb A1c No results for input(s): HGBA1C in the last 72 hours. Lipid Profile No results for input(s): CHOL, HDL, LDLCALC, TRIG, CHOLHDL, LDLDIRECT in the last 72 hours. Thyroid function studies Recent Labs    12/26/16 1053  TSH 2.236   Anemia work up No results for input(s): VITAMINB12, FOLATE, FERRITIN, TIBC, IRON, RETICCTPCT in the last 72 hours. Urinalysis    Component Value Date/Time   COLORURINE YELLOW 12/02/2010 0101   APPEARANCEUR CLOUDY (A) 12/02/2010 0101   LABSPEC 1.020 08/03/2011 1834   PHURINE 5.5 08/03/2011 1834   GLUCOSEU 100 (  A) 08/03/2011 1834   HGBUR TRACE (A) 08/03/2011 1834   BILIRUBINUR MODERATE (A) 08/03/2011 1834   KETONESUR TRACE (A) 08/03/2011 1834   PROTEINUR 30 (A) 08/03/2011 1834   UROBILINOGEN 1.0 08/03/2011 1834   NITRITE NEGATIVE 08/03/2011 1834   LEUKOCYTESUR SMALL (A) 08/03/2011 1834   Sepsis Labs Invalid input(s): PROCALCITONIN,  WBC,  LACTICIDVEN Microbiology No results found for this or any previous visit (from the past 240 hour(s)).   Time coordinating discharge: 45 minutes  SIGNED:   Coralie KeensMauricio Daniel Arrien, MD  [facility] 12/27/2016, 8:20 AM Pager [phone]  If 7PM-7AM, please contact night-coverage [email or web address] Password [email or web address]

## 2016-12-27 NOTE — Progress Notes (Signed)
Pt taken to exit via wheelchair and D/C'd home in private auto. 

## 2017-05-11 ENCOUNTER — Inpatient Hospital Stay (HOSPITAL_COMMUNITY)
Admission: EM | Admit: 2017-05-11 | Discharge: 2017-05-15 | DRG: 880 | Disposition: A | Discharge status: Home / Self Care | Payer: Self-pay | Attending: Family Medicine | Admitting: Family Medicine

## 2017-05-11 ENCOUNTER — Encounter (HOSPITAL_COMMUNITY): Payer: Self-pay | Admitting: Emergency Medicine

## 2017-05-11 DIAGNOSIS — I252 Old myocardial infarction: Secondary | ICD-10-CM

## 2017-05-11 DIAGNOSIS — Z91018 Allergy to other foods: Secondary | ICD-10-CM

## 2017-05-11 DIAGNOSIS — Z6841 Body Mass Index (BMI) 40.0 and over, adult: Secondary | ICD-10-CM

## 2017-05-11 DIAGNOSIS — Z8639 Personal history of other endocrine, nutritional and metabolic disease: Secondary | ICD-10-CM

## 2017-05-11 DIAGNOSIS — T420X6A Underdosing of hydantoin derivatives, initial encounter: Secondary | ICD-10-CM | POA: Diagnosis present

## 2017-05-11 DIAGNOSIS — Z91048 Other nonmedicinal substance allergy status: Secondary | ICD-10-CM

## 2017-05-11 DIAGNOSIS — Z79899 Other long term (current) drug therapy: Secondary | ICD-10-CM

## 2017-05-11 DIAGNOSIS — Z91040 Latex allergy status: Secondary | ICD-10-CM

## 2017-05-11 DIAGNOSIS — W06XXXA Fall from bed, initial encounter: Secondary | ICD-10-CM | POA: Diagnosis present

## 2017-05-11 DIAGNOSIS — F319 Bipolar disorder, unspecified: Secondary | ICD-10-CM | POA: Diagnosis present

## 2017-05-11 DIAGNOSIS — R29700 NIHSS score 0: Secondary | ICD-10-CM | POA: Diagnosis present

## 2017-05-11 DIAGNOSIS — E662 Morbid (severe) obesity with alveolar hypoventilation: Secondary | ICD-10-CM | POA: Diagnosis present

## 2017-05-11 DIAGNOSIS — Z9114 Patient's other noncompliance with medication regimen: Secondary | ICD-10-CM

## 2017-05-11 DIAGNOSIS — R402413 Glasgow coma scale score 13-15, at hospital admission: Secondary | ICD-10-CM | POA: Diagnosis present

## 2017-05-11 DIAGNOSIS — S0081XA Abrasion of other part of head, initial encounter: Secondary | ICD-10-CM | POA: Diagnosis present

## 2017-05-11 DIAGNOSIS — Z91030 Bee allergy status: Secondary | ICD-10-CM

## 2017-05-11 DIAGNOSIS — F419 Anxiety disorder, unspecified: Secondary | ICD-10-CM | POA: Diagnosis present

## 2017-05-11 DIAGNOSIS — Z8659 Personal history of other mental and behavioral disorders: Secondary | ICD-10-CM

## 2017-05-11 DIAGNOSIS — Z7982 Long term (current) use of aspirin: Secondary | ICD-10-CM

## 2017-05-11 DIAGNOSIS — F445 Conversion disorder with seizures or convulsions: Principal | ICD-10-CM | POA: Diagnosis present

## 2017-05-11 DIAGNOSIS — R569 Unspecified convulsions: Secondary | ICD-10-CM

## 2017-05-11 DIAGNOSIS — Z9119 Patient's noncompliance with other medical treatment and regimen: Secondary | ICD-10-CM

## 2017-05-11 DIAGNOSIS — G43909 Migraine, unspecified, not intractable, without status migrainosus: Secondary | ICD-10-CM | POA: Diagnosis present

## 2017-05-11 DIAGNOSIS — G40909 Epilepsy, unspecified, not intractable, without status epilepticus: Secondary | ICD-10-CM

## 2017-05-11 LAB — I-STAT BETA HCG BLOOD, ED (MC, WL, AP ONLY): HCG, QUANTITATIVE: 5.7 m[IU]/mL — AB (ref <5)

## 2017-05-11 NOTE — ED Triage Notes (Signed)
[facility], has h/s of seizures normally on dilantin, pt has 3 seizures at previous hospital pt given dilantin 1250 mg,, 4mg  ativan, 30 mg toradol, 1500 mg keppra, NS 1L, pt did fall earlier today initially. Vitals 144/92, 78 HR, 94% room air, respirations 16

## 2017-05-12 ENCOUNTER — Encounter (HOSPITAL_COMMUNITY): Payer: Self-pay

## 2017-05-12 ENCOUNTER — Inpatient Hospital Stay (HOSPITAL_COMMUNITY): Payer: Self-pay

## 2017-05-12 DIAGNOSIS — F319 Bipolar disorder, unspecified: Secondary | ICD-10-CM

## 2017-05-12 DIAGNOSIS — R569 Unspecified convulsions: Secondary | ICD-10-CM

## 2017-05-12 DIAGNOSIS — G40909 Epilepsy, unspecified, not intractable, without status epilepticus: Secondary | ICD-10-CM

## 2017-05-12 LAB — CBC WITH DIFFERENTIAL/PLATELET
Basophils Absolute: 0 10*3/uL (ref 0.0–0.1)
Basophils Relative: 0 %
EOS ABS: 0.2 10*3/uL (ref 0.0–0.7)
EOS PCT: 2 %
HCT: 42 % (ref 36.0–46.0)
Hemoglobin: 13.8 g/dL (ref 12.0–15.0)
LYMPHS ABS: 2.5 10*3/uL (ref 0.7–4.0)
LYMPHS PCT: 24 %
MCH: 30.4 pg (ref 26.0–34.0)
MCHC: 32.9 g/dL (ref 30.0–36.0)
MCV: 92.5 fL (ref 78.0–100.0)
MONO ABS: 0.8 10*3/uL (ref 0.1–1.0)
MONOS PCT: 7 %
Neutro Abs: 7 10*3/uL (ref 1.7–7.7)
Neutrophils Relative %: 67 %
PLATELETS: 302 10*3/uL (ref 150–400)
RBC: 4.54 MIL/uL (ref 3.87–5.11)
RDW: 13.1 % (ref 11.5–15.5)
WBC: 10.5 10*3/uL (ref 4.0–10.5)

## 2017-05-12 LAB — BASIC METABOLIC PANEL
Anion gap: 11 (ref 5–15)
BUN: 7 mg/dL (ref 6–20)
CHLORIDE: 105 mmol/L (ref 101–111)
CO2: 23 mmol/L (ref 22–32)
Calcium: 8.7 mg/dL — ABNORMAL LOW (ref 8.9–10.3)
Creatinine, Ser: 0.75 mg/dL (ref 0.44–1.00)
GFR calc Af Amer: >60 mL/min (ref >60)
GFR calc non Af Amer: >60 mL/min (ref >60)
GLUCOSE: 102 mg/dL — AB (ref 65–99)
POTASSIUM: 3.8 mmol/L (ref 3.5–5.1)
SODIUM: 139 mmol/L (ref 135–145)

## 2017-05-12 LAB — ETHANOL

## 2017-05-12 LAB — PHENYTOIN LEVEL, TOTAL
PHENYTOIN LVL: 27.4 ug/mL — AB (ref 10.0–20.0)
Phenytoin Lvl: 27 ug/mL — ABNORMAL HIGH (ref 10.0–20.0)
Phenytoin Lvl: 16.5 ug/mL (ref 10.0–20.0)

## 2017-05-12 LAB — CK: Total CK: 30 U/L — ABNORMAL LOW (ref 38–234)

## 2017-05-12 LAB — CBG MONITORING, ED: GLUCOSE-CAPILLARY: 104 mg/dL — AB (ref 65–99)

## 2017-05-12 MED ORDER — SODIUM CHLORIDE 0.9 % IV SOLN
INTRAVENOUS | Status: DC
  Administered 2017-05-12: 1000 mL via INTRAVENOUS
  Administered 2017-05-12: 06:00:00 via INTRAVENOUS

## 2017-05-12 MED ORDER — ACETAMINOPHEN 325 MG PO TABS
650.0000 mg | ORAL_TABLET | Freq: Four times a day (QID) | ORAL | Status: DC | PRN
  Administered 2017-05-12 – 2017-05-15 (×7): 650 mg via ORAL
  Filled 2017-05-12 (×7): qty 2

## 2017-05-12 MED ORDER — ESCITALOPRAM OXALATE 10 MG PO TABS
40.0000 mg | ORAL_TABLET | Freq: Every morning | ORAL | Status: DC
  Administered 2017-05-12 – 2017-05-15 (×4): 40 mg via ORAL
  Filled 2017-05-12 (×2): qty 4
  Filled 2017-05-12: qty 2
  Filled 2017-05-12: qty 4

## 2017-05-12 MED ORDER — ASPIRIN EC 81 MG PO TBEC
81.0000 mg | DELAYED_RELEASE_TABLET | Freq: Every day | ORAL | Status: DC
  Administered 2017-05-12 – 2017-05-15 (×4): 81 mg via ORAL
  Filled 2017-05-12 (×4): qty 1

## 2017-05-12 MED ORDER — PHENYTOIN SODIUM EXTENDED 100 MG PO CAPS
100.0000 mg | ORAL_CAPSULE | Freq: Three times a day (TID) | ORAL | Status: DC
  Administered 2017-05-12 – 2017-05-15 (×11): 100 mg via ORAL
  Filled 2017-05-12 (×11): qty 1

## 2017-05-12 MED ORDER — FENTANYL CITRATE (PF) 100 MCG/2ML IJ SOLN
50.0000 ug | Freq: Once | INTRAMUSCULAR | Status: AC
  Administered 2017-05-12: 50 ug via INTRAVENOUS
  Filled 2017-05-12: qty 2

## 2017-05-12 MED ORDER — PROMETHAZINE HCL 25 MG/ML IJ SOLN
12.5000 mg | Freq: Four times a day (QID) | INTRAMUSCULAR | Status: DC | PRN
  Administered 2017-05-12: 12.5 mg via INTRAVENOUS
  Filled 2017-05-12: qty 1

## 2017-05-12 MED ORDER — LORAZEPAM 2 MG/ML IJ SOLN
1.0000 mg | INTRAMUSCULAR | Status: DC | PRN
  Administered 2017-05-12 – 2017-05-14 (×2): 1 mg via INTRAVENOUS
  Filled 2017-05-12 (×2): qty 1

## 2017-05-12 NOTE — Plan of Care (Signed)
Pt is aware she needs to take her meds as prescribed and do not let the script run out.

## 2017-05-12 NOTE — Clinical Social Work Note (Signed)
Clinical Social Work Assessment  Patient Details  Name: Krista Young MRN: [id number] Date of Birth: 1965-04-24  Date of referral:  05/12/17               Reason for consult:  Facility Placement                Permission sought to share information with:    Permission granted to share information::  No  Name::     none given  Agency::  none given  Relationship::  none given  Contact Information:  none given  Housing/Transportation Living arrangements for the past 2 months:  Independent Living Facility(pt unsure of name) Source of Information:  Patient Patient Interpreter Needed:  None Criminal Activity/Legal Involvement Pertinent to Current Situation/Hospitalization:  No - Comment as needed Significant Relationships:  Friend, Other Family Members Lives with:  Self Do you feel safe going back to the place where you live?  Yes Need for family participation in patient care:  Yes (Comment)  Care giving concerns:  CSW spoke with pt at bedside. At this time pt denies having any concerns to CSW.    Social Worker assessment / plan:  CSW spoke with pt at bedside. During this time CSW was [place]. Pt expressed that they make sure that pt's bills are paid as well as assist with any other needs of pt. Pt informed CSW that pt is unemployed and has some family that helps but not many. Pt is not agreeable to SNF placement as pt expressed that pt will have no one to watch after pt's dogs. During this assessment pt was lying in bed half asleep. CSW noticed that many time pt would doze off to sleep and then wake up and apologize. Assessment ended with pt thanking CSW for stopping by and pt apologizing once more.   Employment status:  Unemployed Health and safety inspectornsurance information:  Medicaid In Engineer, watertate(family planning) PT Recommendations:  Not assessed at this time Information / Referral to community resources:  Skilled Nursing  Facility  Patient/Family's Response to care:  Pt's response to care appeared to be appropriate throughout the converstaion that CSW was able to have with pt.    Patient/Family's Understanding of and Emotional Response to Diagnosis, Current Treatment, and Prognosis:  Pt is understanding that pt may need more assistance than pt can provide for self once leaving the hospital. Pt expressed that pt knows she needs more assistance since pt has began having seizures and not being able to do as much for self as before. Emotional response to this was a little sad as pt demonstrated little to no further interest in discussing this matter with CSW.   Emotional Assessment Appearance:  Appears stated age Attitude/Demeanor/Rapport:  Other(sleepy) Affect (typically observed):  Pleasant Orientation:  Oriented to Self, Oriented to Place, Oriented to  Time, Oriented to Situation Alcohol / Substance use:  Not Applicable Psych involvement (Current and /or in the community):  No (Comment)  Discharge Needs  Concerns to be addressed:  Denies Needs/Concerns at this time Readmission within the last 30 days:  No Current discharge risk:  Lives alone Barriers to Discharge:  Continued Medical Work up   [employer] EnergyKierra S Keesha Pellum, LCSWA 05/12/2017, 7:36 AM

## 2017-05-12 NOTE — Progress Notes (Addendum)
Called due to patient standing up to go to bathroom and began to shake back and fourth. This nurse and the nursing student laid her back down in bed, where she began to shake, become very rigid, foam at the mouth and clench down on her bottom lip. Had a 2 minute post-ictal phase.  Now alert and oriented. Diagnosed to have PNES, was still on Depakote and Phenytoin, until last admission on Dec 18, was taken off Depakote. Was left on Phenytoin. The level was obtained on arrival but 27--question if this is not a trough.  Will reorder just  Before  Next dose.   Patient has LTM which showed non-epileptic spells.   Addendum--new Dilantin level taken at trough is 19.6 corrected  Felicie Mornavid Smith PA-C [facility] [phone]  M-F  (9:00 am- 5:00 PM)  05/12/2017, 1:28 PM   NEUROHOSPITALIST ADDENDUM I have discussed the case with PAC and agree to the plan. She has signficant PNES history and was supposed to be taken off Phenytoin   I have reviewed the contents of history and physical exam as documented by PA/ARNP/Resident and agree with above documentation.  I have discussed and formulated the above plan as documented. Edits to the note have been made as needed.    Georgiana SpinnerSushanth Aroor MD [facility] [phone]   If 7pm to 7am, please call on call as listed on AMION.

## 2017-05-12 NOTE — H&P (Addendum)
History and Physical    Krista Young VHQ:[id number] DOB: 04-01-1965 DOA: 05/11/2017  Referring MD/NP/PA: Demetrios Loll, PA-C PCP: De Blanch, FNP  Patient coming from: Transfer from [facility]  Chief Complaint: Seizures  I have personally briefly reviewed patient's old medical records in [facility]   HPI: Krista Young is a 52 y.o. female with medical history significant of seizure disorder, hypothyroidism, bipolar disorder, substance abuse, noncompliance with medications; who presents as a transfer from [facility] [place] for recurrent seizures.  History is obtained from review of records and the patient.  Patient reportedly stopped taking her Dilantin for the last 3 days.  Patient had had a seizure early yesterday morning where she suffered trauma to her left cheek.  She had being told that her Dilantin level was 0.5, and advised to go to the emergency department for further evaluation.  Patient initially went to [facility] where she was noted to be tachycardic and hypertensive.  Patient was documented to have general tonic-clonic seizure activity.  Given Keppra load of 1500 mg.  Patient had 2 more seizures each lasting less than 15 seconds and was given 4 mg of Ativan.  Dr. Otelia Limes of neurology was consulted and recommended giving the patient a Dilantin load of 15 mg/kg and accepted to transfer.  Patient was noted to have another brief episode of seizure-like activity for which she was given another 4 mg of Ativan with resolution of seizure activity.  Patient was noted to be protecting airway prior to transport.  CT of the brain prior to transport showed no acute abnormalities.   ED Course: Upon admission into the emergency department patient vital signs were noted to be within normal limits.  Lab work was relatively unremarkable.  Dr. Otelia Limes of neurology recommended continuing Dilantin 100 mg 3 times daily and to check Dilantin level in a.m., and  hold Keppra unless patient continues to have breakthrough seizures.  Review of Systems  Unable to perform ROS: Mental status change  Neurological: Positive for seizures.    Past Medical History:  Diagnosis Date  . Anxiety   . Bipolar 1 disorder (HCC)   . Depression   . Endometriosis   . History of alcoholism (HCC)   . Hyperlipidemia   . Hypothyroidism   . Myocardial infarction (HCC)   . Obstructive sleep apnea   . Seizures (HCC)     Past Surgical History:  Procedure Laterality Date  . ABDOMINAL SURGERY     laproscopic sx for endometriosis  . NASAL SINUS SURGERY       reports that she has never smoked. She has never used smokeless tobacco. She reports that she does not drink alcohol or use drugs.  Allergies  Allergen Reactions  . Bee Venom Shortness Of Breath and Rash    Welts  . Corn-Containing Products Anaphylaxis    And headaches  . Pork-Derived Products Anaphylaxis    And headaches  . Latex Itching, Rash and Other (See Comments)    (And) feels like her "body is on fire"  . Tape Itching and Rash    Family History  Problem Relation Age of Onset  . Alcohol abuse Mother   . Hypertension Mother   . Hyperlipidemia Mother   . Stroke Mother   . Heart disease Mother   . Alcohol abuse Father   . Heart disease Maternal Aunt   . Cancer Maternal Uncle   . Heart disease Maternal Grandmother     Prior to Admission medications  Medication Sig Start Date End Date Taking? Authorizing Provider  aspirin EC 81 MG tablet Take 81 mg by mouth daily.   Yes [provider]  cyclobenzaprine (FLEXERIL) 5 MG tablet Take 5 mg by mouth 3 (three) times daily as needed for muscle spasms.   Yes [provider]  escitalopram (LEXAPRO) 20 MG tablet Take 1 tablet (20 mg total) by mouth every morning. Patient taking differently: Take 40 mg by mouth every morning.  12/27/16 05/11/17 Yes Arrien, York Ram, MD  nitroGLYCERIN (NITROSTAT) 0.4 MG SL tablet Place 0.4 mg  under the tongue every 5 (five) minutes as needed for chest pain.   Yes [provider]  phenytoin (DILANTIN) 100 MG ER capsule Take 100 mg by mouth 3 (three) times daily.   Yes [provider]    Physical Exam:  Constitutional: Patient postictal with decreased level of alertness Vitals:   05/12/17 0030 05/12/17 0130 05/12/17 0300 05/12/17 0324  BP: (!) 123/91   110/76  Pulse: 89 92 87 86  SpO2: 99% 96% 99% 91%  Weight:      Height:       Eyes: PERRL, lids and conjunctivae normal ENMT: Mucous membranes are dry. Posterior pharynx clear of any exudate or lesions.  Neck: normal, supple, no masses, no thyromegaly Respiratory: clear to auscultation bilaterally, no wheezing, no crackles. Normal respiratory effort. No accessory muscle use.  Cardiovascular: Regular rate and rhythm, no murmurs / rubs / gallops. No extremity edema. 2+ pedal pulses. No carotid bruits.  Abdomen: no tenderness, no masses palpated. No hepatosplenomegaly. Bowel sounds positive.  Musculoskeletal: no clubbing / cyanosis. No joint deformity upper and lower extremities. Good ROM, no contractures. Normal muscle tone.  Skin: abrasion to left cheek Neurologic: CN 2-12 grossly intact. Sensation intact, DTR normal. Strength 5/5 in all 4.  Psychiatric: Normal judgment and insight. Alert and oriented x 3. Normal mood.     Labs on Admission: I have personally reviewed following labs and imaging studies  CBC: Recent Labs  Lab 05/11/17 2352  WBC 10.5  NEUTROABS 7.0  HGB 13.8  HCT 42.0  MCV 92.5  PLT 302   Basic Metabolic Panel: Recent Labs  Lab 05/11/17 2352  NA 139  K 3.8  CL 105  CO2 23  GLUCOSE 102*  BUN 7  CREATININE 0.75  CALCIUM 8.7*   GFR: Estimated Creatinine Clearance: 116.6 mL/min (by C-G formula based on SCr of 0.75 mg/dL). Liver Function Tests: No results for input(s): AST, ALT, ALKPHOS, BILITOT, PROT, ALBUMIN in the last 168 hours. No results for input(s): LIPASE, AMYLASE  in the last 168 hours. No results for input(s): AMMONIA in the last 168 hours. Coagulation Profile: No results for input(s): INR, PROTIME in the last 168 hours. Cardiac Enzymes: No results for input(s): CKTOTAL, CKMB, CKMBINDEX, TROPONINI in the last 168 hours. BNP (last 3 results) No results for input(s): PROBNP in the last 8760 hours. HbA1C: No results for input(s): HGBA1C in the last 72 hours. CBG: Recent Labs  Lab 05/12/17 0145  GLUCAP 104*   Lipid Profile: No results for input(s): CHOL, HDL, LDLCALC, TRIG, CHOLHDL, LDLDIRECT in the last 72 hours. Thyroid Function Tests: No results for input(s): TSH, T4TOTAL, FREET4, T3FREE, THYROIDAB in the last 72 hours. Anemia Panel: No results for input(s): VITAMINB12, FOLATE, FERRITIN, TIBC, IRON, RETICCTPCT in the last 72 hours. Urine analysis:    Component Value Date/Time   COLORURINE YELLOW 12/02/2010 0101   APPEARANCEUR CLOUDY (A) 12/02/2010 0101   LABSPEC 1.020  08/03/2011 1834   PHURINE 5.5 08/03/2011 1834   GLUCOSEU 100 (A) 08/03/2011 1834   HGBUR TRACE (A) 08/03/2011 1834   BILIRUBINUR MODERATE (A) 08/03/2011 1834   KETONESUR TRACE (A) 08/03/2011 1834   PROTEINUR 30 (A) 08/03/2011 1834   UROBILINOGEN 1.0 08/03/2011 1834   NITRITE NEGATIVE 08/03/2011 1834   LEUKOCYTESUR SMALL (A) 08/03/2011 1834   Sepsis Labs: No results found for this or any previous visit (from the past 240 hour(s)).   Radiological Exams on Admission: No results found. Assessment/Plan Recurrent seizures secondary to noncompliance: Acute.  Suspect secondary to history of noncompliance with medications. - Admit to telemetry bed - Seizure order set initiated - Seizure precautions - Check CK - Check Dilantin level in a.m. - Continue current dose of Dilantin 100 mg 3 times daily - Ativan IV prn seizure activity - Normal saline IV fluids 100 mL/h - Appreciate neurology consultative service, follow-up for further recommendations   Bipolar disorder -  Continue Lexapro  DVT prophylaxis: SCDs Code Status: full Family Communication: none present Disposition Plan: home once medically  Consults called: Neurology Admission status: Observation  Clydie Braunondell A Marye Eagen MD [facility] Pager [phone]   If 7PM-7AM, please contact night-coverage [email or web address] Password [email or web address]  05/12/2017, 3:37 AM

## 2017-05-12 NOTE — Consult Note (Addendum)
NEURO HOSPITALIST CONSULT NOTE   Requestig physician: Dr. Bebe Shaggy  Reason for Consult: Seizure  History obtained from:  Patient and Chart     HPI:                                                                                                                                          Krista Young is an 52 y.o. female initially presenting to OSH for seizure. She has a history of seizures and is normally on Dilantin but has been noncompliant as indicated by low level at OSH, per communication with outside MD. She was administered Keppra 1500 mg but had 3 short generalized seizures at the OSH afterwards. Dilantin 1250 mg was then loaded. She also received 4 mg Ativan.   On arrival to [facility] ED, she was sleeping on EDPs initial evaluation, easily arousable and conversant.  Past Medical History:  Diagnosis Date  . Anxiety   . Bipolar 1 disorder (HCC)   . Depression   . Endometriosis   . History of alcoholism (HCC)   . Hyperlipidemia   . Hypothyroidism   . Myocardial infarction (HCC)   . Obstructive sleep apnea   . Seizures (HCC)     Past Surgical History:  Procedure Laterality Date  . ABDOMINAL SURGERY     laproscopic sx for endometriosis  . NASAL SINUS SURGERY      Family History  Problem Relation Age of Onset  . Alcohol abuse Mother   . Hypertension Mother   . Hyperlipidemia Mother   . Stroke Mother   . Heart disease Mother   . Alcohol abuse Father   . Heart disease Maternal Aunt   . Cancer Maternal Uncle   . Heart disease Maternal Grandmother               Social History:  reports that she has never smoked. She has never used smokeless tobacco. She reports that she does not drink alcohol or use drugs.  Allergies  Allergen Reactions  . Bee Venom Shortness Of Breath and Rash    Welts  . Corn-Containing Products Anaphylaxis    And headaches  . Pork-Derived Products Anaphylaxis    And headaches  . Latex Itching, Rash and Other (See  Comments)    (And) feels like her "body is on fire"  . Tape Itching and Rash    HOME MEDICATIONS:  Current Meds  Medication Sig  . aspirin EC 81 MG tablet Take 81 mg by mouth daily.  . cyclobenzaprine (FLEXERIL) 5 MG tablet Take 5 mg by mouth 3 (three) times daily as needed for muscle spasms.  Marland Kitchen. escitalopram (LEXAPRO) 20 MG tablet Take 1 tablet (20 mg total) by mouth every morning. (Patient taking differently: Take 40 mg by mouth every morning. )  . nitroGLYCERIN (NITROSTAT) 0.4 MG SL tablet Place 0.4 mg under the tongue every 5 (five) minutes as needed for chest pain.  . phenytoin (DILANTIN) 100 MG ER capsule Take 100 mg by mouth 3 (three) times daily.    ROS:                                                                                                                                       As per HPI.  Blood pressure (!) 123/91, pulse 92, height 5\' 7"  (1.702 m), weight 127 kg (280 lb), SpO2 96 %.  General Examination:                                                                                                      HEENT-  [place]/AT    Lungs- Respirations unlabored Extremities- Noncyanotic  Neurological Examination Mental Status: Mildly postictal with mild decreased level of alertness and some difficulty with orientation. Speech fluent without evidence of aphasia.  Able to follow all commands without difficulty. Cranial Nerves: II: Visual fields grossly normal without extinction to DSS  III,IV, VI: EOMI. No ptosis.  V,VII: Smile symmetric, facial temp sensation normal bilaterally VIII: hearing intact to voice IX,X: No hypophonia XI: Symmetric  XII: midline tongue extension Motor: Right : Upper extremity   5/5    Left:     Upper extremity   5/5  Lower extremity   5/5     Lower extremity   5/5 Normal tone throughout; no atrophy noted Sensory: Temp and light touch  intact x 4 Deep Tendon Reflexes: 3+ and symmetric throughout Plantars: Right: downgoing  Left: downgoing Cerebellar: No ataxia with FNF bilaterally Gait: Deferred   Lab Results: Basic Metabolic Panel: No results for input(s): NA, K, CL, CO2, GLUCOSE, BUN, CREATININE, CALCIUM, MG, PHOS in the last 168 hours.  CBC: No results for input(s): WBC, NEUTROABS, HGB, HCT, MCV, PLT in the last 168 hours.  Cardiac Enzymes: No results for input(s): CKTOTAL, CKMB, CKMBINDEX, TROPONINI in the last 168 hours.  Lipid Panel: No results for input(s): CHOL, TRIG, HDL, CHOLHDL, VLDL, LDLCALC  in the last 168 hours.  Imaging: No results found.  Assessment: Breakthrough seizures due to medication noncompliance x 3 days 1. Ran out of Dilantin. Has had issues with higher cost meds in the past. 2. Currently postictal. No evidence for seizures on exam, which is nonfocal  Recommendations: 1. Continue Dilantin at 100 mg PO TID 2. Obtain AM Dilantin level 3. Hold further Keppra unless a breakthrough seizure occurs 4. If she needs a second anticonvulsant as an outpatient, would consider carbamazepine due to its low cost and efficacy.   Electronically signed: Dr. Caryl Pina 05/12/2017, 1:43 AM

## 2017-05-12 NOTE — Progress Notes (Signed)
At approx 12:47 pt had a witnessed seizure. [facility] the room. Pt stated she needed to use the bathroom, stood up and began to shake back and fourth. This nurse and the nursing student laid her back down in bed, where she began to shake, become very rigid, foam at the mouth and clench down on her bottom lip. Assistance was called and given IV ativan. Neuro PA was called.

## 2017-05-12 NOTE — Progress Notes (Signed)
Patient seen and examined and agree with the history assessment and plan as per Dr. Katrinka BlazingSmith who just saw the patient this morning  Essentially this is a 52 year old female with history of bipolar hypothyroid hyperkalemia OHSS habitus who has been admitted previously with acute encephalopathy-last admission 12/27/2016-at that admission psychiatry was consulted and it was concluded that bipolar was affecting mental status changes and psychiatry recommended medication adjustment presented as above It is also documented that she has been noncompliant on her meds including Dilantin--presented as above-neurologist saw patient in consult earlier this morning  On exam BP (!) 135/98   Pulse 85   Resp 18   Ht 5\' 7"  (1.702 m)   Wt 127 kg (280 lb)   SpO2 100%   BMI 43.85 kg/m   Awake alert in no distress I ache all over" Has a bruise to the left frontal maxillary area Range of motion intact to all major muscle groups with no limitation S1-S2 no murmur Abdomen soft nontender obese No lower extremity edema Extraocular movements intact   Assessment/plan Continue Dilantin as per neurologist, holding Keppra as per their instructions--apparently was taken off of Depakote at last admission 12/2016 secondary to CYP 450 interactions? CK is not elevated Phenytoin level is supratherapeutic--dose adjustment as per neurology if needed-recheck in maybe 3 to 4 days?  Rest as per above no family present suspected inpatient status required at least

## 2017-05-12 NOTE — ED Provider Notes (Signed)
[facility]   CSN: [id number] Arrival date & time: 05/11/17  2319     History   Chief Complaint Chief Complaint  Patient presents with  . Seizures    HPI Krista SineCatherine J Young is a 52 y.o. female.  HPI 52 year old Caucasian female past medical history significant for history of alcohol abuse, bipolar disorder, depression, seizures who is noncompliant with medication presents to the emergency department today after [facility].  Patient reports that she had a seizure this morning.  She states that she has not taken her Dilantin for the past 3 days.  Patient states that she did have her levels drawn last week by her primary care doctor who stated that her Dilantin level was 0.5.  According to notes from prior provider at outside hospital patient had thorough exam including CT scans of head and neck at that time.  She had lab work that was relatively reassuring.-Patient did have 3 additional seizures while in the emergency department.  She was initially given a Keppra load of 1500 mg.  Patient had second seizure and was given additional Ativan.  Patient had total of 4 mg of Ativan.  They spoke with Dr. Otelia LimesLindzen with neurology at our facility who accepts patient in transfer.  They recommend giving patient Dilantin load.  Patient has had no further seizure-like activity.  On examination she is mildly groggy from medication however she is alert oriented x3.  The patient complains of some pain over her left side of her face from the fall out of the bed today.  Denies any other pain.  Patient states that she has been admitted for this before in the past for ongoing seizures.  Denies any alcohol use.  Denies any recent illnesses.  Denies any drug use.  Pt denies any fever, chill, ha, vision changes, lightheadedness, dizziness, congestion, neck pain, cp, sob, cough, abd pain, n/v/d, urinary symptoms, change in  bowel habits, melena, hematochezia, lower extremity paresthesias.  Past Medical History:  Diagnosis Date  . Anxiety   . Bipolar 1 disorder (HCC)   . Depression   . Endometriosis   . History of alcoholism (HCC)   . Hyperlipidemia   . Hypothyroidism   . Myocardial infarction (HCC)   . Obstructive sleep apnea   . Seizures [facility](HCC)     Patient Active Problem List   Diagnosis Date Noted  . Seizure (HCC) 12/23/2016  . Hyperkalemia 12/23/2016  . Arm pain, right 02/26/2010  . SLEEP APNEA, OBSTRUCTIVE 09/09/2009  . HYPOTHYROIDISM 10/23/2008  . HYPERLIPIDEMIA 12/10/2007  . BIPOLAR AFFECTIVE DISORDER 12/10/2007  . ANXIETY 12/10/2007  . ALCOHOLISM 12/10/2007  . DEPRESSION 12/10/2007    Past Surgical History:  Procedure Laterality Date  . ABDOMINAL SURGERY     laproscopic sx for endometriosis  . NASAL SINUS SURGERY       OB History   None      Home Medications    Prior to Admission medications   Medication Sig Start Date End Date Taking? Authorizing Provider  aspirin EC 81 MG tablet Take 81 mg by mouth daily.   Yes [provider]  cyclobenzaprine (FLEXERIL) 5 MG tablet Take 5 mg by mouth 3 (three) times daily as needed for muscle spasms.   Yes [provider]  escitalopram (LEXAPRO) 20 MG tablet Take 1 tablet (20 mg total) by mouth every morning. Patient taking differently: Take 40 mg by mouth every morning.  12/27/16 05/11/17 Yes  Arrien, York Ram, MD  nitroGLYCERIN (NITROSTAT) 0.4 MG SL tablet Place 0.4 mg under the tongue every 5 (five) minutes as needed for chest pain.   Yes [provider]  phenytoin (DILANTIN) 100 MG ER capsule Take 100 mg by mouth 3 (three) times daily.   Yes [provider]    Family History Family History  Problem Relation Age of Onset  . Alcohol abuse Mother   . Hypertension Mother   . Hyperlipidemia Mother   . Stroke Mother   . Heart disease Mother   . Alcohol abuse Father   . Heart disease Maternal  Aunt   . Cancer Maternal Uncle   . Heart disease Maternal Grandmother     Social History Social History   Tobacco Use  . Smoking status: Never Smoker  . Smokeless tobacco: Never Used  Substance Use Topics  . Alcohol use: No  . Drug use: No     Allergies   Bee venom; Corn-containing products; Pork-derived products; Latex; and Tape   Review of Systems Review of Systems  All other systems reviewed and are negative.    Physical Exam Updated Vital Signs BP (!) 123/91   Pulse 92   Ht 5\' 7"  (1.702 m)   Wt 127 kg (280 lb)   SpO2 96%   BMI 43.85 kg/m   Physical Exam  Constitutional: She is oriented to person, place, and time. She appears well-developed and well-nourished.  Non-toxic appearance. No distress.  HENT:  Head: Normocephalic and atraumatic.  Nose: Nose normal.  Mouth/Throat: Oropharynx is clear and moist.  Patient with ecchymosis and abrasions to the left cheek.  No open wound noted.  No bilateral hemotympanum.  No septal hematoma.  Dentition intact.  No skull depression noted.  Eyes: Pupils are equal, round, and reactive to light. Conjunctivae and EOM are normal. Right eye exhibits no discharge. Left eye exhibits no discharge.  Neck: Normal range of motion. Neck supple.  No c spine midline tenderness. No paraspinal tenderness. No deformities or step offs noted. Full ROM. Supple. No nuchal rigidity.    Cardiovascular: Normal rate, regular rhythm, normal heart sounds and intact distal pulses. Exam reveals no gallop and no friction rub.  No murmur heard. Pulmonary/Chest: Effort normal and breath sounds normal. No stridor. No respiratory distress. She has no wheezes. She has no rales. She exhibits no tenderness.  Abdominal: Soft. Bowel sounds are normal. There is no tenderness. There is no rebound and no guarding.  Musculoskeletal: Normal range of motion. She exhibits no tenderness.  No midline T spine or L spine tenderness. No deformities or step offs noted. Full  ROM. Pelvis is stable.   Lymphadenopathy:    She has no cervical adenopathy.  Neurological: She is alert and oriented to person, place, and time.  The patient is alert, attentive, and oriented x 3.  She is sleepy however arousable to verbal stimuli.  Speech is clear. Cranial nerve II-VII grossly intact. Negative pronator drift. Sensation intact. Strength 5/5 in all extremities. Reflexes 2+ and symmetric at biceps, triceps, knees, and ankles. Rapid alternating movement and fine finger movements intact. Romberg is absent. Posture and gait normal.   Skin: Skin is warm and dry. Capillary refill takes less than 2 seconds.  Psychiatric: Her behavior is normal. Judgment and thought content normal.  Nursing note and vitals reviewed.    ED Treatments / Results  Labs (all labs ordered are listed, but only abnormal results are displayed) Labs Reviewed  PHENYTOIN LEVEL, TOTAL -  Abnormal; Notable for the following components:      Result Value   Phenytoin Lvl 27.4 (*)    All other components within normal limits  CBG MONITORING, ED - Abnormal; Notable for the following components:   Glucose-Capillary 104 (*)    All other components within normal limits  I-STAT BETA HCG BLOOD, ED ([facility], [facility], [facility] ONLY) - Abnormal; Notable for the following components:   I-stat hCG, quantitative 5.7 (*)    All other components within normal limits    EKG EKG Interpretation  Date/Time:  Thursday May 11 2017 23:27:46 EDT Ventricular Rate:  96 PR Interval:    QRS Duration: 109 QT Interval:  356 QTC Calculation: 446 R Axis:   71 Text Interpretation:  Sinus rhythm Ventricular premature complex Aberrant conduction of SV complex(es) Low voltage with right axis deviation Confirmed by Zadie Rhine ([id number]) on 05/11/2017 11:31:28 PM   Radiology No results found.  Procedures Procedures (including critical care time)  Medications Ordered in ED Medications  fentaNYL (SUBLIMAZE) injection 50 mcg (50 mcg  Intravenous Given 05/12/17 0137)     Initial Impression / Assessment and Plan / ED Course  I have reviewed the triage vital signs and the nursing notes.  Pertinent labs & imaging results that were available during my care of the patient were reviewed by me and considered in my medical decision making (see chart for details).     Patient presents to the ED for evaluation of ongoing seizures after being transferred from outside facility.  Patient has had approximately 3-4 short generalized seizures at the outside hospital.  She was given a Keppra load 1500 mg, 4 mg of Ativan and 1250 mg of Dilantin.  Patient was discussed with our neurology Dr. Otelia Limes who accepted patient in transfer to the ED.  On exam vital signs are reassuring.  Patient is somewhat sleepy but easily arousable with verbal stimuli.  She has small abrasion to the left cheek.  Outside CT scans were obtained and reviewed and showed no acute abnormalities.  Outside lab work was obtained that showed leukocytosis of 13,000.  Mild hypokalemia 3.2.  Does not seem that patient received any oral potassium.  Her liver enzymes are mildly elevated.  Patient with history of alcohol abuse.  Patient has no focal neuro deficit on my examination.  Dr. Otelia Limes is at bedside with neurology to evaluate patient.  He recommends hospital admission.  Continue Dilantin with repeat Dilantin level in the a.m.  Hold Keppra at this time.  I spoke with Dr. Katrinka Blazing with hospital medicine who agrees to admission and will see patient in the ED and place admission orders.  Patient made hemodynamically stable this time and appears to be in no acute distress.  Updated on plan of care.   Patient seen by my attending who is agreeable to the above plan.  Final Clinical Impressions(s) / ED Diagnoses   Final diagnoses:  Seizure-like activity [facility])    ED Discharge Orders    None       Wallace Keller 05/12/17 1914    Zadie Rhine, MD 05/13/17  [phone]

## 2017-05-12 NOTE — ED Notes (Signed)
Patient was given a cup of coffee. 

## 2017-05-12 NOTE — ED Notes (Signed)
Called lab to add on lab work 

## 2017-05-12 NOTE — Progress Notes (Signed)
vLTM EEG running. Notified Neuro 

## 2017-05-12 NOTE — Progress Notes (Signed)
Had just left patient's room, went to med room. NT calls me and says patient states she wants to go home. Entered the room to see twitching with some facial movements. She looked and me when I callled her name. Stated "I want my shoes"  The twitiching lasted and 20 seconds. There was tires, no incontinence, patient resting, bed alarm on.

## 2017-05-12 NOTE — ED Provider Notes (Signed)
Patient seen/examined in the Emergency Department in conjunction with Midlevel Provider [place] Patient reports seizure earlier today, transferred here from outside facility Exam : Patient sleeping on my arrival to room, easily arousable and conversant.  She has abrasion to left maxilla.  Otherwise acting appropriately Plan: Consult neurology and may need admission   Krista Young, Krista Reisig, MD 05/12/17 [phone]

## 2017-05-13 DIAGNOSIS — G40909 Epilepsy, unspecified, not intractable, without status epilepticus: Secondary | ICD-10-CM

## 2017-05-13 LAB — CBC WITH DIFFERENTIAL/PLATELET
Basophils Absolute: 0 10*3/uL (ref 0.0–0.1)
Basophils Relative: 0 %
EOS ABS: 0.2 10*3/uL (ref 0.0–0.7)
Eosinophils Relative: 2 %
HCT: 40.3 % (ref 36.0–46.0)
HEMOGLOBIN: 13.7 g/dL (ref 12.0–15.0)
LYMPHS ABS: 1.6 10*3/uL (ref 0.7–4.0)
LYMPHS PCT: 23 %
MCH: 31.6 pg (ref 26.0–34.0)
MCHC: 34 g/dL (ref 30.0–36.0)
MCV: 92.9 fL (ref 78.0–100.0)
MONOS PCT: 7 %
Monocytes Absolute: 0.5 10*3/uL (ref 0.1–1.0)
NEUTROS PCT: 68 %
Neutro Abs: 4.8 10*3/uL (ref 1.7–7.7)
Platelets: 281 10*3/uL (ref 150–400)
RBC: 4.34 MIL/uL (ref 3.87–5.11)
RDW: 13.4 % (ref 11.5–15.5)
WBC: 7 10*3/uL (ref 4.0–10.5)

## 2017-05-13 LAB — COMPREHENSIVE METABOLIC PANEL
ALT: 50 U/L (ref 14–54)
AST: 33 U/L (ref 15–41)
Albumin: 3.5 g/dL (ref 3.5–5.0)
Alkaline Phosphatase: 111 U/L (ref 38–126)
Anion gap: 9 (ref 5–15)
BUN: 9 mg/dL (ref 6–20)
CHLORIDE: 103 mmol/L (ref 101–111)
CO2: 23 mmol/L (ref 22–32)
CREATININE: 0.77 mg/dL (ref 0.44–1.00)
Calcium: 8.5 mg/dL — ABNORMAL LOW (ref 8.9–10.3)
GFR calc non Af Amer: >60 mL/min (ref >60)
Glucose, Bld: 134 mg/dL — ABNORMAL HIGH (ref 65–99)
POTASSIUM: 3.6 mmol/L (ref 3.5–5.1)
SODIUM: 135 mmol/L (ref 135–145)
Total Bilirubin: 0.7 mg/dL (ref 0.3–1.2)
Total Protein: 6.3 g/dL — ABNORMAL LOW (ref 6.5–8.1)

## 2017-05-13 LAB — PHENYTOIN LEVEL, TOTAL: Phenytoin Lvl: 15.9 ug/mL (ref 10.0–20.0)

## 2017-05-13 NOTE — Progress Notes (Addendum)
AT about 0805, staff was talking with patient; she became upset; began shaking and wouldn't speak; lasted about ; 30 seconds; still quiet but returning to baseline; will notifiy Neurology.

## 2017-05-13 NOTE — Progress Notes (Signed)
PROGRESS NOTE  Krista Young AVW:[id number] DOB: Nov 04, 1965 DOA: 05/11/2017 PCP: De Blanch, FNP  Brief Narrative:    52 year old female with history of bipolar hypothyroid hyperkalemia OHSS habitus who has been admitted previously with acute encephalopathy-last admission 12/27/2016-at that admission psychiatry was consulted and it was concluded that bipolar was affecting mental status changes and psychiatry recommended medication adjustment presented as above It is also documented that she has been noncompliant on her meds including Dilantin--presented as above-neurologist saw patient in consult earlier this morning  Assessment/Plan   Seizure disorder-non-epileptogenic?Psychogenic?  Review of [facility] notes September 2018 shows 6 events that were deemed to be nonepileptic -deferring to neurology regarding medication changes etc. etc. greatly appreciate Dr. Laurence Slate input and time spent explaining to patient--- she will need to follow-up with Dr. Tyler Deis who apparently was he rNeurologist currently is on Dilantin ER 100 every 8--- Ativan 1-2 every 2 as needed seizure--long-term EEG was performed which did not show any epileptic activity despite episode a.m. 4/27  Bipolar disorder-admitted 12/18-Continue Lexapro 40 a.m.  OHSS-could trial CPAP if she is willing.    DVT prophylaxis: loveonx Code Status:  full Family Communication:  IP Disposition Plan: unclear a tthis time   Pleas Koch, MD [facility] [phone]  05/13/2017, 3:52 PM  LOS: 1 day   Consultants:  Neurology  Procedures:  Nonfocal EEG  Antimicrobials:  none  Interval history/Subjective: Events noted from this morning with spell of anger shaking and eye closing-patient also had angry outburst towards neurologist apparently-I had reviewed patient's with the neurologist and then subsequently-patient is much calmer-no other seizure episodes noted so far today  Objective: Vitals:    Vitals:   05/13/17 0023 05/13/17 0441  BP: 129/85 140/80  Pulse: 70 70  Resp: 18 18  Temp: 99 F (37.2 C) 97.9 F (36.6 C)  SpO2: 96% 96%    Exam:  Constitutional:  . Appears calm and comfortable Eyes:  . pupils and irises appear normal  ENMT:  . grossly normal hearing  . Lips appear normal . Oropharynx: mucosa, tongue,posterior pharynx appear normal, Mallampati 3 Neck:  . neck appears normal, no masses, normal ROM, supple . no thyromegaly Respiratory:  . CTA bilaterally, no w/r/r.  . Respiratory effort normal. No retractions or accessory muscle use Cardiovascular:  . RRR, no m/r/g . No LE extremity edema   . Normal pedal pulses Abdomen:  . Abdomen appears normal; no tenderness or masses . No hernias . No HSM Musculoskeletal:  . Digits/nails BUE: no clubbing, cyanosis .  muscle bulk is intact E   o strength and tone normal, no atrophy, no abnormal movements o No tenderness, masses o Normal ROM, no contractures  . gait and station Skin:  . No rashes, lesions, ulcers . palpation of skin: no induration or nodules Neurologic:  . CN 2-12 intact . Sensation all 4 extremities intact Psychiatric:  . Mental status o Mood, affect appropriate o Orientation to person, place, time  . judgement and insight appear normal she was however anxious and agitated earlier this morning   I have personally reviewed the following:   Labs:  Pertinent positives calcium low at 8.5 total protein low at 6.3 glucose 134  Imaging studies:  EEG reviewed  Medical tests:  Reviewed  Test discussed with performing physician:  Discussed EEG with neurologist and face-to-face discussion with him as well  Decision to obtain old records:  Made-and reviewed  Review and summation of old records:  Reviewed in detail  Scheduled Meds: . aspirin EC  81 mg Oral Daily  . escitalopram  40 mg Oral q morning - 10a  . phenytoin  100 mg Oral Q8H   Continuous Infusions: . sodium  chloride 1,000 mL (05/12/17 2220)    Principal Problem:   Recurrent seizures (HCC) Active Problems:   BIPOLAR AFFECTIVE DISORDER   LOS: 1 day

## 2017-05-13 NOTE — Progress Notes (Signed)
vLTM EEG complete. No skin breakdown 

## 2017-05-13 NOTE — Procedures (Signed)
LTM-EEG Report  HISTORY: Continuous video-EEG monitoring performed for 52 year old with PNES, shaking events. ACQUISITION: International 10-20 system for electrode placement; 18 channels with additional eyes linked to ipsilateral ears and EKG. Additional T1-T2 electrodes were used. Continuous video recording obtained.   EEG NUMBER:  MEDICATIONS:  Day 1:  VPA  DAY #1: from 1602 05/12/17 to 0730 05/13/17    BACKGROUND: An overall medium voltage continuous recording with good spontaneous variability and reactivity. Waking background consisted of a medium voltage 10-11 Hz posterior dominant rhythm bilaterally with low voltage beta activity in the bilateral frontocentral regions and some medium voltage theta activity diffusely. Sleep was captured with normal stage II sleep architecture.  EPILEPTIFORM/PERIODIC ACTIVITY: none SEIZURES: none EVENTS: There were two typical clinical events of facial flushing, reported facial twitching, unresponsiveness, and some rocking movements back and forth of the head and torso. There was a normal waking EEG background with both events. These appeared consistent with psychogenic nonepileptic spells.  EKG: no significant arrhythmia  SUMMARY: This was a normal continuous video EEG with no epileptiform discharges or seizures. Two psychogenic unresponsive and mild shaking events were captured without EEG correlate.

## 2017-05-13 NOTE — Progress Notes (Signed)
MEDICATION RELATED CONSULT NOTE - FOLLOW UP   Pharmacy Consult for phenytoin Indication: seizures  Allergies  Allergen Reactions  . Bee Venom Shortness Of Breath and Rash    Welts  . Latex Itching, Rash and Other (See Comments)    (And) feels like her "body is on fire"  . Tape Itching and Rash    Patient Measurements: Height:  (170.2 cm) Weight: 280 lb (127 kg) IBW/kg (Calculated) : 61.6  Estimated Creatinine Clearance: 116.6 mL/min (by C-G formula based on SCr of 0.77 mg/dL).   Assessment: 51 YOF transferred from OSH with seizures. She had a phenytoin level of 0.68mcg/mL and was instructed to go to an ED. She was given a phenytoin load of  IV x1 @ ~2130 on 4/25 and then was transferred here. She has had a few witnessed seizures both at OSH and here. She was started on phenytoin  IV q8h per recommendations of neurology- this is her home dose, but she reportedly was not taking it for at least 3 days PTA.   Two phenytoin levels were obtained after patient transferred here which were both elevated- only reflective of the load, and were drawn too soon in relation to the time dose was given, so difficult to interpret. A level drawn 4/26 @ 1341 (before next dose) was 16.72mcg/mL- corrects to ~67mcg/mL. Another level was drawn this morning @ 0430 (before next dose) and resulted at 15.23mcg/mL- corrects to 19.23mcg/mL.  Patient with good renal function, albumin 3.5 this morning.  Goal of Therapy:  Total phenytoin level 10-6mcg/mL  Plan:  -Continue phenytoin  IV q8h -Would not check another level until 4/30 at the earliest- a level drawn on this day will be much more useful as it will be at true steady state -Follow for side effects, repeat seizures, any new antiepileptic medications  Mandisa Persinger D. Maymie Brunke, PharmD, BCPS Clinical Pharmacist Clinical Phone for 05/13/2017 until 3:30pm: [phone] If after 3:30pm, please call main pharmacy at [phone] 05/13/2017 12:11 PM

## 2017-05-13 NOTE — Progress Notes (Signed)
Spoke with patient at length re:  Frustration at having lost her independence / job / feelings of inadequacies / injuries, etc.  MDs in to discuss the EEG results.  She appeared angry at learning that her seizures were of psychogenic origin.  Lashed out at verbally at staff present in room.  Clearly she faces frustration and confusion and feels that staff feels that she is "faking it" or that she is "crazy".  I assured her that this was not the case and Dr. Mahala Menghini also assured her that we were determined to find the correct medications to normalize her life.  Emotional support given to patient by staff.  Much calmer as result.

## 2017-05-13 NOTE — Progress Notes (Addendum)
Reason for consult:   Subjective:Patient had 2 spells characterized by facial twitching,becoming unresponsive and shaking of the head and body. These were nonepileptic recorded on long-term EEG.   ROS: negative except above   Examination  Vital signs in last 24 hours: Temp:  [97.9 F (36.6 C)-99 F (37.2 C)] 97.9 F (36.6 C) (04/27 0441) Pulse Rate:  [70-96] 70 (04/27 0441) Resp:  [16-20] 18 (04/27 0441) BP: (119-140)/(68-85) 140/80 (04/27 0441) SpO2:  [96 %-97 %] 96 % (04/27 0441)  General: appears upset, for straight CVS: pulse-normal rate and rhythm RS: breathing comfortably Extremities: normal   Neuro: MS: Alert, oriented, follows commands,impaired  memory CN: pupils equal and reactive,  EOMI, face symmetric, tongue midline, normal sensation over face, Motor: 5/5 strength in all 4 extremities Coordination: normal  Basic Metabolic Panel: Recent Labs  Lab 05/11/17 2352 05/13/17 0829  NA 139 135  K 3.8 3.6  CL 105 103  CO2 23 23  GLUCOSE 102* 134*  BUN 7 9  CREATININE 0.75 0.77  CALCIUM 8.7* 8.5*    CBC: Recent Labs  Lab 05/11/17 2352 05/13/17 0829  WBC 10.5 7.0  NEUTROABS 7.0 4.8  HGB 13.8 13.7  HCT 42.0 40.3  MCV 92.5 92.9  PLT 302 281     Coagulation Studies: No results for input(s): LABPROT, INR in the last 72 hours.  Imaging Reviewed:     ASSESSMENT AND PLAN  Patient is a 52 year old female with history of bipolar disorder, alcohol abuse with now 1-2 years of recurrent episodes of seizure-like activity of what are likely non epileptic spells ( PNES). Presents after having multiple spells after running out of Dilantin.    She was initially diagnosed to have seizures after routine EEG showed left temporal slowing is on multiple seizure medications - by her neurologist at [facility].  She continued to have spells and underwent EMU  admission September 2018 and diagnosed to have PNES. She was being weaned off her seizure medications and  was supposed to be on Depakote for migraines, not seizures.She was also taking Dilantin.  She was admitted in December 2018 with multiple spells-  I offered the patient long-term EEG, however she declined. The following day she was seen by Dr. Otelia Limes, who stopped her Depakote (due to elavated ammonia) and had her continue dilantin.  She continues to be on Dilantin since discharge and was doing well, however ran out of her medications and had a concerning for seizure. He went to outside hospital and received Keppra which caused her to have more spells. She was transferred to [facility]- was loaded with Dilantin.  She had more spells while in the hospital, and therefore we decided to pursue an EEG to evaluate for her spells.  She had 2 nonepileptic spells here while on Dilantin, background was not suggestive of epileptiform activity.   Impression  Likely nonepileptic spells, till now no electrographic seizures have been recorded. Possible seizure disorder  Plan:   1)Patient has poor memory, will need to discuss diagnosis and present family/close friend.   2) Continue Phenytoin for now - I am hesitant to completely stop all her seizure medications as I am not sure if these spells she is having currently are similar spells she had when she presented to the hospital. ( Per Dr Otelia Limes she looked post ictal on her exam on arrival) - There are a proportion of patients can have both epileptic seizures and nonepileptic spells. At the same time I will not be  escalating her seizure medications. May benefit from mood stabilizer/seizure medication such as Lamotrigine which has to be titrated as outpatient.    3) Social worker consult- patient has lost job due to her spells- has no money to see neurologist.   4) Neurocognitive assessment as outpatient   Spent greater than 25 min counseling patient on diagnosis. Waited for patient's friend who had not come to the hospital yet during day shift.  Requested night shift neurology to check if family/friend was at hospital.      Krista Young [facility] Pager Number [phone] For questions after 7pm please refer to AMION to reach the Neurologist on call

## 2017-05-14 NOTE — Progress Notes (Signed)
RN was called to patient's room by NT, patient had advised the NT that she was having a seizure.  Patient's entire body was shaking and not verbally responding to RN or NT however patient followed all of the RN's & NT's commands.    Once patient's body stopped shaking, patient advised that she needed some water because she was thirsty.  Patient now resting in bed, bed alarm on and call light with in reach  RN will continue to monitor patient.

## 2017-05-14 NOTE — Progress Notes (Signed)
PROGRESS NOTE  Krista Young HYQ:[id number] DOB: September 13, 1965 DOA: 05/11/2017 PCP: De Blanch, FNP  Brief Narrative:    51-fem bipolar hypothyroid hyperkalemia OHSS habitus who has been admitted previously with acute encephalopathy-last admission 12/27/2016-at that admission psychiatry was consulted and it was concluded that bipolar was affecting mental status changes and psychiatry recommended medication adjustment presented as above It is also documented that she has been noncompliant on her meds including Dilantin--presented as above-neurologist saw patient in consult earlier this morning  Assessment/Plan   Seizure disorder-non-epileptogenic?Psychogenic?  Review of [facility] notes September 2018 shows 6 events that were deemed to be nonepileptic -deferring to neurology regarding medication changes etc. etc. greatly appreciate Dr. Laurence Slate input and time spent explaining to patient--- OP  follow-up  Dr. Tyler Deis  Neurologist--- currently is on Dilantin ER 100 every 8--- Ativan 1-2 every 2 as needed seizure--long-term EEG was performed which did not show any epileptic activity despite episode a.m. 4/27  Bipolar disorder-admitted 12/18-Continue Lexapro 40 a.m.  OHSS-could trial CPAP if she is willing.    DVT prophylaxis: loveonx Code Status:  full Family Communication:  IP Disposition Plan: Await further input and clinical stability without seizure-like activity over the next 24 hours and potentially discharge-discussed with neurologist Dr. Laurence Slate today and will need initiation of Lamictal as an outpatient and titration of the same  Pleas Koch, MD [facility] [phone]  05/14/2017, 4:37 PM  LOS: 2 days   Consultants:  Neurology  Procedures:  Nonfocal EEG  Antimicrobials:  none  Interval history/Subjective:  event of nonepileptic seizure-like activity noted For some reason still given Ativan? Patient is "tired" no other issues tolerating full diet  and overall feels fair  Objective: Vitals:  Vitals:   05/14/17 0848 05/14/17 1334  BP: 122/88 121/67  Pulse: (!) 112   Resp: 18 18  Temp: 98.3 F (36.8 C)   SpO2: 98% 98%    Exam:  Constitutional:  . Appears calm and comfortable Eyes:  . pupils and irises appear normal  ENMT:  . grossly normal hearing   . Oropharynx: mucosa, tongue,posterior pharynx appear normal, Mallampati 3 Neck:  . neck appears normal, no masses, normal ROM, supple . no thyromegaly Respiratory:  . CTA bilaterally, no w/r/r.  . Respiratory effort normal. No retractions or accessory muscle use Cardiovascular:  . RRR, no m/r/g . No LE extremity edema   . Normal pedal pulses Abdomen:  . Abdomen appears normal; no tenderness or masses . No HSM Musculoskeletal:  . Digits/nails BUE: no clubbing, cyanosis .  muscle bulk is intact E   o strength and tone normal, no atrophy, no abnormal movements o No tenderness, masses o Normal ROM, no contractures  . gait and station Skin:  . No rashes, lesions, ulcers  Neurologic:  . CN 2-12 intact . Sensation all 4 extremities intact Psychiatric:  . Mental status o Mood, affect appropriate o Orientation to person, place, time  . judgement and insight appear normal she was however anxious and agitated earlier this morning   I have personally reviewed the following:   Labs:  No labs reviewed today  Imaging studies:  EEG reviewed  Medical tests:  Reviewed  Test discussed with performing physician:  Discussed plan of care with neurologist  Decision to obtain old records:    Review and summation of old records:    Scheduled Meds: . aspirin EC  81 mg Oral Daily  . escitalopram  40 mg Oral q morning - 10a  . phenytoin  100 mg Oral Q8H   Continuous Infusions:   Principal Problem:   Recurrent seizures (HCC) Active Problems:   BIPOLAR AFFECTIVE DISORDER   LOS: 2 days

## 2017-05-15 ENCOUNTER — Encounter (HOSPITAL_COMMUNITY): Payer: Self-pay | Admitting: Neurology

## 2017-05-15 MED ORDER — LAMOTRIGINE 25 MG PO TABS: 25.0000 mg | ORAL_TABLET | Freq: Two times a day (BID) | ORAL | 0 refills | Status: DC

## 2017-05-15 NOTE — Progress Notes (Signed)
Received Call from Dr. Mahala Menghini, requesting PT see patient asap for vestibular evaluation prior to d/c home. Called rehab admin and they will stated they would page someone to see her.

## 2017-05-15 NOTE — Progress Notes (Addendum)
Met with patient, she was awake and conversant.   She has bipolar disorder documented in her chart and has been on lamictal in the past. My partner was concerned with stopping all of her AEDs and I think that this is reasonable. It would be prudent to use a medication that could be helpful to her in more than one was rather than dilantin, and lamictal has been suggested which I feel would be reasonable.   Lamictal, titrate to 147m BID, 260mBID x 2 weeks, then 5039mID x 2 weeks, then 100m3mD. Once on 100mg61mx 1 week, can stop dilantin.    McNeiRoland Rack[facility] [phone]- 7am, please page neurology on call as listed in [place]

## 2017-05-15 NOTE — Care Management Note (Addendum)
Case Management Note  Patient Details  Name: Krista Young MRN: [id number] Date of Birth: 05-02-1965  Subjective/Objective:                    Action/Plan: Pt discharging home with self care. Pt does not have insurance but does have a PCP. Pt uses a discount card to obtain her medications. CM called her pharmacy and the Lamictal will cost her $4. Pt aware. Pt states she has transportation home.  Pt with orders for a cane. James with [facility] made aware and delivered it to the room.   Expected Discharge Date:  05/15/17               Expected Discharge Plan:  Home/Self Care  In-House Referral:     Discharge planning Services     Post Acute Care Choice:    Choice offered to:     DME Arranged:    DME Agency:     HH Arranged:    HH Agency:     Status of Service:  Completed, signed off  If discussed at [employer] of Stay Meetings, dates discussed:    Additional Comments:  Kermit Balo, RN 05/15/2017, 5:32 PM

## 2017-05-15 NOTE — Discharge Summary (Signed)
Physician Discharge Summary  Krista Young UJW:[id number] DOB: May 06, 1965 DOA: 05/11/2017  PCP: De Blanch, FNP  Admit date: 05/11/2017 Discharge date: 05/15/2017  Time spent: 30 minutes  Recommendations for Outpatient Follow-up:  1. Needs dilantin level in 2-5 days 2. Started lamictal-titrate up for mood as OP 3. Given Cane to work with on d/c  Discharge Diagnoses:  Principal Problem:   Recurrent seizures (HCC) Active Problems:   BIPOLAR AFFECTIVE DISORDER   Discharge Condition: improved  Diet recommendation:  hh low salt  Filed Weights   05/11/17 2325  Weight: 127 kg (280 lb)    History of present illness:  51-fem bipolar hypothyroid hyperkalemia OHSS habitus who has been admitted previously with acute encephalopathy-last admission 12/27/2016-at that admission psychiatry was consulted and it was concluded that bipolar was affecting mental status changes and psychiatry recommended medication adjustment presented as above It is also documented that she has been noncompliant on her meds including Dilantin--presented as above-neurologist saw patient in consult earlier this morning    Hospital Course:    Seizure disorder-non-epileptogenic?Psychogenic?  Review of [facility] notes September 2018 shows 6 events that were deemed to be nonepileptic -starting lamictal 25 bid OP  follow-up  Dr. Tyler Deis  Neurologist vs titration by PCP as OP-- currently is on Dilantin ER 100 every 8--does need Dilantin level q5 days for at least 1-2 weeks to ensure remains therapeutic  long-term EEG was performed which did not show any epileptic activity despite episode a.m. 4/27  Bipolar disorder-admitted 12/18-Continue Lexapro 40 a.m.  Starting lamictal for mood and Sz  OHSS-could trial CPAP if she is willing.    Procedures:  LTM eeg Scans of brain Consultations:  Neurology  Discharge Exam: Vitals:   05/15/17 1201 05/15/17 1644  BP:  136/79  Pulse:  74  Resp: 20  20  Temp: 98.3 F (36.8 C) 98.4 F (36.9 C)  SpO2: 95% 99%    General: awake alert a little wobbly today in nad Cardiovascular:  s1 s 2no m/r/g Respiratory: clear no adde dsound Vestibular signs are neg to my exam-no saccades  Discharge Instructions   Discharge Instructions    Diet - low sodium heart healthy   Complete by:  As directed    Discharge instructions   Complete by:  As directed    Please follow with your primary MD and get labs checked for your dilantin level in about 3-5 days We will start Lamictal at very low doses and your primary MD should be able to increase this gradually   Increase activity slowly   Complete by:  As directed      Allergies as of 05/15/2017      Reactions   Bee Venom Shortness Of Breath, Rash   Welts   Latex Itching, Rash, Other (See Comments)   (And) feels like her "body is on fire"   Tape Itching, Rash      Medication List    STOP taking these medications   cyclobenzaprine 5 MG tablet Commonly known as:  FLEXERIL     TAKE these medications   aspirin EC 81 MG tablet Take 81 mg by mouth daily.   escitalopram 20 MG tablet Commonly known as:  LEXAPRO Take 1 tablet (20 mg total) by mouth every morning. What changed:  how much to take   lamoTRIgine 25 MG tablet Commonly known as:  LAMICTAL Take 1 tablet (25 mg total) by mouth 2 (two) times daily for 60 doses.   nitroGLYCERIN 0.4 MG SL  tablet Commonly known as:  NITROSTAT Place 0.4 mg under the tongue every 5 (five) minutes as needed for chest pain.   phenytoin 100 MG ER capsule Commonly known as:  DILANTIN Take 100 mg by mouth 3 (three) times daily.            Durable Medical Equipment  (From admission, onward)        Start     Ordered   05/15/17 1601  For home use only DME Cane  Once     05/15/17 1601     Allergies  Allergen Reactions  . Bee Venom Shortness Of Breath and Rash    Welts  . Latex Itching, Rash and Other (See Comments)    (And) feels like her  "body is on fire"  . Tape Itching and Rash      The results of significant diagnostics from this hospitalization (including imaging, microbiology, ancillary and laboratory) are listed below for reference.    Significant Diagnostic Studies: No results found.  Microbiology: No results found for this or any previous visit (from the past 240 hour(s)).   Labs: Basic Metabolic Panel: Recent Labs  Lab 05/11/17 2352 05/13/17 0829  NA 139 135  K 3.8 3.6  CL 105 103  CO2 23 23  GLUCOSE 102* 134*  BUN 7 9  CREATININE 0.75 0.77  CALCIUM 8.7* 8.5*   Liver Function Tests: Recent Labs  Lab 05/13/17 0829  AST 33  ALT 50  ALKPHOS 111  BILITOT 0.7  PROT 6.3*  ALBUMIN 3.5   No results for input(s): LIPASE, AMYLASE in the last 168 hours. No results for input(s): AMMONIA in the last 168 hours. CBC: Recent Labs  Lab 05/11/17 2352 05/13/17 0829  WBC 10.5 7.0  NEUTROABS 7.0 4.8  HGB 13.8 13.7  HCT 42.0 40.3  MCV 92.5 92.9  PLT 302 281   Cardiac Enzymes: Recent Labs  Lab 05/12/17 0503  CKTOTAL 30*   BNP: BNP (last 3 results) No results for input(s): BNP in the last 8760 hours.  ProBNP (last 3 results) No results for input(s): PROBNP in the last 8760 hours.  CBG: Recent Labs  Lab 05/12/17 0145  GLUCAP 104*       Signed:  Rhetta Mura MD   [facility] 05/15/2017, 4:57 PM

## 2017-05-15 NOTE — Evaluation (Signed)
Physical Therapy Evaluation/Vestibular Evaluation Patient Details Name: Krista Young MRN: [id number] DOB: Nov 05, 1965 Today's Date: 05/15/2017   History of Present Illness  Krista Young is a 52 y.o. female with medical history significant of seizure disorder, hypothyroidism, bipolar disorder, substance abuse, noncompliance with medications; who presents as a transfer from [facility] for recurrent seizures.    Clinical Impression  Patient presents with decreased balance and some vague symptoms of visual changes affecting her as well since seizures.  Feel currently symptoms related more to episodes than vestibular pathology.  Feel using a cane temporarily till she feels more safe is appropriate and we talked about fall prevention.  Patient safe for d/c home no current follow up PT needs.  Will follow acutely for balance activities if not d/c.    Follow Up Recommendations No PT follow up    Equipment Recommendations  Cane    Recommendations for Other Services       Precautions / Restrictions Precautions Precautions: Fall      Mobility  Bed Mobility Overal bed mobility: Needs Assistance Bed Mobility: Sit to Sidelying;Sidelying to Sit   Sidelying to sit: Min assist     Sit to sidelying: Min assist General bed mobility comments: for positioning for BPPV testing  Transfers Overall transfer level: Needs assistance   Transfers: Sit to/from Stand Sit to Stand: Supervision         General transfer comment: for safety with some initial imbalance  Ambulation/Gait Ambulation/Gait assistance: Min guard;Supervision Ambulation Distance (Feet): 150 Feet(x 2) Assistive device: None;Straight cane Gait Pattern/deviations: Step-through pattern;Decreased stride length;Drifts right/left     General Gait Details: noted to be drifting at times, esp with head turns, see DGI; improved stability and less assist (S) using SPC  Stairs Stairs: Yes Stairs assistance:  Modified independent (Device/Increase time) Stair Management: One rail Right;Alternating pattern;Forwards Number of Stairs: 3    Wheelchair Mobility    Modified Rankin (Stroke Patients Only)       Balance Overall balance assessment: Needs assistance   Sitting balance-Leahy Scale: Good       Standing balance-Leahy Scale: Fair                   Standardized Balance Assessment Standardized Balance Assessment : Dynamic Gait Index   Dynamic Gait Index Level Surface: Mild Impairment Change in Gait Speed: Mild Impairment Gait with Horizontal Head Turns: Moderate Impairment Gait with Vertical Head Turns: Mild Impairment Gait and Pivot Turn: Normal Step Over Obstacle: Mild Impairment Step Around Obstacles: Normal Steps: Mild Impairment Total Score: 17       Pertinent Vitals/Pain Pain Assessment: No/denies pain  Orthostatic VS for the past 24 hrs (Last 3 readings):  BP- Lying Pulse- Lying BP- Sitting Pulse- Sitting BP- Standing at 0 minutes Pulse- Standing at 0 minutes BP- Standing at 3 minutes Pulse- Standing at 3 minutes  05/15/17 1652 - - 137/83 79 125/85 105 (!) 125/95 95      Home Living Family/patient expects to be discharged to:: Private residence Living Arrangements: Alone Available Help at Discharge: Friend(s);Available PRN/intermittently Type of Home: Apartment Home Access: Stairs to enter Entrance Stairs-Rails: None Entrance Stairs-Number of Steps: 1 Home Layout: One level Home Equipment: None      Prior Function Level of Independence: Independent         Comments: takes dogs on walks 2 at a time (has 4), friend helps to get places as she does not drive; couple of falls seizure related  Hand Dominance        Extremity/Trunk Assessment        Lower Extremity Assessment Lower Extremity Assessment: Overall WFL for tasks assessed       Communication   Communication: No difficulties  Cognition Arousal/Alertness:  Awake/alert Behavior During Therapy: WFL for tasks assessed/performed Overall Cognitive Status: Within Functional Limits for tasks assessed                                        General Comments    Vestibular Assessment - 05/15/17 0001      Vestibular Assessment   General Observation  reports imbalance and sometimes seeing spots since her seizures this time.      Symptom Behavior   Type of Dizziness  Imbalance    Frequency of Dizziness  intermittent    Duration of Dizziness  minutes    Aggravating Factors  Activity in general    Relieving Factors  Rest      Occulomotor Exam   Occulomotor Alignment  Normal    Spontaneous  Absent    Gaze-induced  Absent    Smooth Pursuits  Intact lots of eye blinking    Saccades  Intact lots of eye blinking      Vestibulo-Occular Reflex   VOR 1 Head Only (x 1 viewing)  only able to perform few head turns with horizontal VOR due to feeling like everything is moving; better with vertical movements    VOR to Slow Head Movement  Normal    VOR Cancellation  Normal      Auditory   Comments  decreased hearing in R ear compared to L with scratch test       Positional Testing   Sidelying Test  Sidelying Right;Sidelying Left      Sidelying Right   Sidelying Right Duration  30 sec    Sidelying Right Symptoms  No nystagmus      Sidelying Left   Sidelying Left Duration  30 sec    Sidelying Left Symptoms  No nystagmus         Exercises     Assessment/Plan    PT Assessment Patent does not need any further PT services  PT Problem List         PT Treatment Interventions      PT Goals (Current goals can be found in the Care Plan section)  Acute Rehab PT Goals PT Goal Formulation: All assessment and education complete, DC therapy    Frequency     Barriers to discharge        Co-evaluation               AM-PAC PT "6 Clicks" Daily Activity  Outcome Measure Difficulty turning over in bed (including adjusting  bedclothes, sheets and blankets)?: None Difficulty moving from lying on back to sitting on the side of the bed? : None Difficulty sitting down on and standing up from a chair with arms (e.g., wheelchair, bedside commode, etc,.)?: None Help needed moving to and from a bed to chair (including a wheelchair)?: None Help needed walking in hospital room?: A Little Help needed climbing 3-5 steps with a railing? : A Little 6 Click Score: 22    End of Session Equipment Utilized During Treatment: Gait belt Activity Tolerance: Patient tolerated treatment well Patient left: in bed;with call bell/phone within reach   PT Visit Diagnosis: Other abnormalities of  gait and mobility (R26.89);Other symptoms and signs involving the nervous system (R29.898)    Time: 5409-8119 PT Time Calculation (min) (ACUTE ONLY): 37 min   Charges:   PT Evaluation $PT Eval Moderate Complexity: 1 Mod PT Treatments $Gait Training: 8-22 mins   PT G CodesSheran Lawless, [place] [phone] 05/15/2017   Elray Mcgregor 05/15/2017, 5:05 PM

## 2018-02-20 ENCOUNTER — Inpatient Hospital Stay
Admission: EM | Admit: 2018-02-20 | Payer: Self-pay | Source: Other Acute Inpatient Hospital | Admitting: Internal Medicine

## 2018-02-20 NOTE — Care Management (Signed)
This is a no charge note  Transfer from  [place] per Dr. Ebbie Latus  53 year old lady with a past medical history for alcohol abuse, hyperlipidemia, hypothyroidism, depression with anxiety, CAD, myocardial infarction, OSA, seizure, who presents with seizure.  Her seizure started from yesterday. She has had 4 episodes of seizure with prolonged postictal status.  Patient had fall.  Images are negative for acute issues except for soft tissue swelling, including CT of head, CT of C-spine and CT of maxillofacial film.  Patient was loaded with 1 g of Keppra and 1 g of phenytoin, and did not have seizure in the past several hours.  Vital signs are stable.  Blood pressure 122/83.  Neurologist, Dr. Amada Jupiter was consulted, who agreed to consult on this case.  Patient is accepted to telemetry bed as inpatient.  Please call manager of [facility] at [phone] when pt arrives to floor   Lorretta Harp, MD  [facility]   If 7PM-7AM, please contact night-coverage [email or web address] Password [facility] 02/20/2018, 10:52 PM

## 2018-10-18 DIAGNOSIS — F411 Generalized anxiety disorder: Secondary | ICD-10-CM | POA: Diagnosis not present

## 2018-10-25 DIAGNOSIS — F4310 Post-traumatic stress disorder, unspecified: Secondary | ICD-10-CM | POA: Diagnosis not present

## 2018-10-27 DIAGNOSIS — R509 Fever, unspecified: Secondary | ICD-10-CM | POA: Diagnosis not present

## 2018-10-27 DIAGNOSIS — J209 Acute bronchitis, unspecified: Secondary | ICD-10-CM | POA: Diagnosis not present

## 2018-10-27 DIAGNOSIS — J0100 Acute maxillary sinusitis, unspecified: Secondary | ICD-10-CM | POA: Diagnosis not present

## 2018-11-01 ENCOUNTER — Other Ambulatory Visit: Payer: Self-pay

## 2018-11-01 ENCOUNTER — Emergency Department (HOSPITAL_COMMUNITY)
Admission: EM | Admit: 2018-11-01 | Discharge: 2018-11-01 | Disposition: A | Discharge status: Home / Self Care | Payer: PPO | Attending: Emergency Medicine | Admitting: Emergency Medicine

## 2018-11-01 ENCOUNTER — Emergency Department (HOSPITAL_COMMUNITY): Payer: PPO

## 2018-11-01 DIAGNOSIS — R569 Unspecified convulsions: Secondary | ICD-10-CM | POA: Diagnosis not present

## 2018-11-01 DIAGNOSIS — F4310 Post-traumatic stress disorder, unspecified: Secondary | ICD-10-CM | POA: Diagnosis not present

## 2018-11-01 DIAGNOSIS — Z8709 Personal history of other diseases of the respiratory system: Secondary | ICD-10-CM | POA: Diagnosis not present

## 2018-11-01 DIAGNOSIS — E039 Hypothyroidism, unspecified: Secondary | ICD-10-CM | POA: Insufficient documentation

## 2018-11-01 DIAGNOSIS — I252 Old myocardial infarction: Secondary | ICD-10-CM | POA: Diagnosis not present

## 2018-11-01 DIAGNOSIS — R000 Tachycardia, unspecified: Secondary | ICD-10-CM | POA: Diagnosis not present

## 2018-11-01 DIAGNOSIS — M7918 Myalgia, other site: Secondary | ICD-10-CM | POA: Insufficient documentation

## 2018-11-01 DIAGNOSIS — U071 COVID-19: Secondary | ICD-10-CM | POA: Diagnosis not present

## 2018-11-01 DIAGNOSIS — F445 Conversion disorder with seizures or convulsions: Secondary | ICD-10-CM | POA: Diagnosis not present

## 2018-11-01 LAB — BASIC METABOLIC PANEL
Anion gap: 11 (ref 5–15)
BUN: 12 mg/dL (ref 6–20)
CO2: 20 mmol/L — ABNORMAL LOW (ref 22–32)
Calcium: 8.8 mg/dL — ABNORMAL LOW (ref 8.9–10.3)
Chloride: 109 mmol/L (ref 98–111)
Creatinine, Ser: 1.11 mg/dL — ABNORMAL HIGH (ref 0.44–1.00)
GFR calc Af Amer: >60 mL/min (ref >60)
GFR calc non Af Amer: 57 mL/min — ABNORMAL LOW (ref >60)
Glucose, Bld: 123 mg/dL — ABNORMAL HIGH (ref 70–99)
Potassium: 4.2 mmol/L (ref 3.5–5.1)
Sodium: 140 mmol/L (ref 135–145)

## 2018-11-01 LAB — CBC WITH DIFFERENTIAL/PLATELET
Abs Immature Granulocytes: 0.25 10*3/uL — ABNORMAL HIGH (ref 0.00–0.07)
Basophils Absolute: 0.1 10*3/uL (ref 0.0–0.1)
Basophils Relative: 1 %
Eosinophils Absolute: 0.1 10*3/uL (ref 0.0–0.5)
Eosinophils Relative: 0 %
HCT: 49.3 % — ABNORMAL HIGH (ref 36.0–46.0)
Hemoglobin: 15 g/dL (ref 12.0–15.0)
Immature Granulocytes: 2 %
Lymphocytes Relative: 14 %
Lymphs Abs: 2.3 10*3/uL (ref 0.7–4.0)
MCH: 28.7 pg (ref 26.0–34.0)
MCHC: 30.4 g/dL (ref 30.0–36.0)
MCV: 94.4 fL (ref 80.0–100.0)
Monocytes Absolute: 0.5 10*3/uL (ref 0.1–1.0)
Monocytes Relative: 3 %
Neutro Abs: 12.8 10*3/uL — ABNORMAL HIGH (ref 1.7–7.7)
Neutrophils Relative %: 80 %
Platelets: 409 10*3/uL — ABNORMAL HIGH (ref 150–400)
RBC: 5.22 MIL/uL — ABNORMAL HIGH (ref 3.87–5.11)
RDW: 14.2 % (ref 11.5–15.5)
WBC: 16 10*3/uL — ABNORMAL HIGH (ref 4.0–10.5)
nRBC: 0 % (ref 0.0–0.2)

## 2018-11-01 LAB — COMPREHENSIVE METABOLIC PANEL
ALT: 24 U/L (ref 0–44)
AST: 35 U/L (ref 15–41)
Albumin: 4 g/dL (ref 3.5–5.0)
Alkaline Phosphatase: 88 U/L (ref 38–126)
Anion gap: 22 — ABNORMAL HIGH (ref 5–15)
BUN: 15 mg/dL (ref 6–20)
CO2: 12 mmol/L — ABNORMAL LOW (ref 22–32)
Calcium: 9.2 mg/dL (ref 8.9–10.3)
Chloride: 111 mmol/L (ref 98–111)
Creatinine, Ser: 1.45 mg/dL — ABNORMAL HIGH (ref 0.44–1.00)
GFR calc Af Amer: 48 mL/min — ABNORMAL LOW (ref >60)
GFR calc non Af Amer: 42 mL/min — ABNORMAL LOW (ref >60)
Glucose, Bld: 122 mg/dL — ABNORMAL HIGH (ref 70–99)
Potassium: 4 mmol/L (ref 3.5–5.1)
Sodium: 145 mmol/L (ref 135–145)
Total Bilirubin: 0.9 mg/dL (ref 0.3–1.2)
Total Protein: 7.5 g/dL (ref 6.5–8.1)

## 2018-11-01 LAB — ETHANOL: Alcohol, Ethyl (B): <10 mg/dL (ref <10)

## 2018-11-01 LAB — PHENYTOIN LEVEL, TOTAL: Phenytoin Lvl: <2.5 ug/mL — ABNORMAL LOW (ref 10.0–20.0)

## 2018-11-01 MED ORDER — SODIUM CHLORIDE 0.9 % IV SOLN
1750.0000 mg | Freq: Once | INTRAVENOUS | Status: DC
  Filled 2018-11-01: qty 35

## 2018-11-01 MED ORDER — LORAZEPAM 2 MG/ML IJ SOLN
1.0000 mg | Freq: Once | INTRAMUSCULAR | Status: AC | PRN
  Administered 2018-11-01: 1 mg via INTRAVENOUS
  Filled 2018-11-01: qty 1

## 2018-11-01 MED ORDER — LORAZEPAM 2 MG/ML IJ SOLN
2.0000 mg | Freq: Once | INTRAMUSCULAR | Status: AC
  Administered 2018-11-01: 2 mg via INTRAVENOUS

## 2018-11-01 MED ORDER — SODIUM CHLORIDE 0.9 % IV BOLUS
1000.0000 mL | Freq: Once | INTRAVENOUS | Status: AC
  Administered 2018-11-01: 14:00:00 1000 mL via INTRAVENOUS

## 2018-11-01 NOTE — ED Notes (Signed)
As soon as ativan was given, pt began having appropriate conversation -- speech clear, no further combativeness--   States started having body aches, shortness of breath, fever-- has a hx of asthma-- other symptoms started Thursday or Friday last week.  took tylenol at approx 9am

## 2018-11-01 NOTE — ED Provider Notes (Signed)
Care transferred to me.  BMP is improved including better renal function.  Anion gap is now normal.  Dilantin level sent given her medication list but after talking to patient she tells me she has not been on Dilantin for a while.  Thus no need for IV Dilantin.  No further seizure-like activity.  Based on prior history this sounds more like a nonepileptic seizure but either way she appears stable for discharge home.   Sherwood Gambler, MD 11/01/18 [phone]

## 2018-11-01 NOTE — ED Provider Notes (Signed)
Emergency Department Provider Note   I have reviewed the triage vital signs and the nursing notes.   HISTORY  Chief Complaint possible seizure and covid   HPI Krista Young is a 53 y.o. female with recent COVID diagnosis and history of seizure with non-epileptiform component presents to the emergency department with seizure activity.  She arrived by private vehicle with seizure-like activity in the car.  She was transported to the acute care area for resuscitative efforts.  After IV was established and patient was given Ativan she began speaking and providing a history.  She tells me she is been compliant with her medicines.  She has had bronchitis-like symptoms and fever over the past week and tested positive for COVID-19 in outside urgent care.  She has had associated body aches.  She denies active pain other than body ache type pain.    Past Medical History:  Diagnosis Date  . Anxiety   . Depression   . Endometriosis   . History of alcoholism (HCC)   . Hyperlipidemia   . Hypothyroidism   . Myocardial infarction (HCC)   . Obstructive sleep apnea   . Seizures [facility](HCC)     Patient Active Problem List   Diagnosis Date Noted  . Recurrent seizures (HCC) 05/12/2017  . Seizure (HCC) 12/23/2016  . Hyperkalemia 12/23/2016  . Arm pain, right 02/26/2010  . SLEEP APNEA, OBSTRUCTIVE 09/09/2009  . HYPOTHYROIDISM 10/23/2008  . HYPERLIPIDEMIA 12/10/2007  . BIPOLAR AFFECTIVE DISORDER 12/10/2007  . ANXIETY 12/10/2007  . ALCOHOLISM 12/10/2007  . DEPRESSION 12/10/2007    Past Surgical History:  Procedure Laterality Date  . ABDOMINAL SURGERY     laproscopic sx for endometriosis  . NASAL SINUS SURGERY      Allergies Bee venom, Latex, and Tape  Family History  Problem Relation Age of Onset  . Alcohol abuse Mother   . Hypertension Mother   . Hyperlipidemia Mother   . Stroke Mother   . Heart disease Mother   . Alcohol abuse Father   . Heart disease Maternal Aunt   .  Cancer Maternal Uncle   . Heart disease Maternal Grandmother     Social History Social History   Tobacco Use  . Smoking status: Never Smoker  . Smokeless tobacco: Never Used  Substance Use Topics  . Alcohol use: No  . Drug use: No    Review of Systems  Constitutional: Positive fever and body aches.  Eyes: No visual changes. ENT: Positive sore throat. Cardiovascular: Denies chest pain. Respiratory: Denies shortness of breath. Positive cough.  Gastrointestinal: No abdominal pain.  No nausea, no vomiting.  No diarrhea.  No constipation. Genitourinary: Negative for dysuria. Musculoskeletal: Negative for back pain. Skin: Negative for rash. Neurological: Negative for headaches, focal weakness or numbness. Seizure like activity.   10-point ROS otherwise negative.  ____________________________________________   PHYSICAL EXAM:  VITAL SIGNS: ED Triage Vitals [11/01/18 1352]  Enc Vitals Group     BP (!) 146/76     Pulse Rate (!) 117     Resp (!) 22     Temp 99.1 F (37.3 C)     Temp Source Oral     SpO2 100 %     Weight 279 lb 15.8 oz (127 kg)   Constitutional: Patient arrives on stretcher pushing and pulling at staff.  She had a brief episode of thrashing around in the bed in a nonspecific way.  Not consistent with typical tonic-clonic seizure.  Eyes: Conjunctivae are normal.  Head:  Atraumatic. Nose: No congestion/rhinnorhea. Mouth/Throat: Mucous membranes are moist. No tongue injury.  Neck: No stridor.   Cardiovascular: Tachycardia. Good peripheral circulation. Grossly normal heart sounds.   Respiratory: Normal respiratory effort.  No retractions. Lungs CTAB. Gastrointestinal: Soft and nontender. No distention.  Musculoskeletal: No lower extremity tenderness nor edema. No gross deformities of extremities. Neurologic:  Normal speech and language. No gross focal neurologic deficits are appreciated.  Skin:  Skin is warm, dry and intact. No rash noted.   ____________________________________________   LABS (all labs ordered are listed, but only abnormal results are displayed)  Labs Reviewed  COMPREHENSIVE METABOLIC PANEL - Abnormal; Notable for the following components:      Result Value   CO2 12 (*)    Glucose, Bld 122 (*)    Creatinine, Ser 1.45 (*)    GFR calc non Af Amer 42 (*)    GFR calc Af Amer 48 (*)    Anion gap 22 (*)    All other components within normal limits  CBC WITH DIFFERENTIAL/PLATELET - Abnormal; Notable for the following components:   WBC 16.0 (*)    RBC 5.22 (*)    HCT 49.3 (*)    Platelets 409 (*)    Neutro Abs 12.8 (*)    Abs Immature Granulocytes 0.25 (*)    All other components within normal limits  BASIC METABOLIC PANEL - Abnormal; Notable for the following components:   CO2 20 (*)    Glucose, Bld 123 (*)    Creatinine, Ser 1.11 (*)    Calcium 8.8 (*)    GFR calc non Af Amer 57 (*)    All other components within normal limits  PHENYTOIN LEVEL, TOTAL - Abnormal; Notable for the following components:   Phenytoin Lvl <2.5 (*)    All other components within normal limits  ETHANOL   ____________________________________________  EKG   EKG Interpretation  Date/Time:  Thursday November 01 2018 13:50:18 EDT Ventricular Rate:  117 PR Interval:    QRS Duration: 96 QT Interval:  302 QTC Calculation: 422 R Axis:   63 Text Interpretation:  Sinus tachycardia Low voltage, precordial leads No STEMI  Confirmed by Nanda Quinton [phone]) on 11/01/2018 2:30:25 PM       ____________________________________________  RADIOLOGY  Dg Chest Portable 1 View  Result Date: 11/01/2018 CLINICAL DATA:  COVID-19 positive EXAM: PORTABLE CHEST 1 VIEW COMPARISON:  10/27/2016 FINDINGS: The heart size and mediastinal contours are within normal limits. No focal airspace consolidation, pleural effusion, or pneumothorax. The visualized skeletal structures are unremarkable. IMPRESSION: No acute cardiopulmonary findings.  Electronically Signed   By: Davina Poke M.D.   On: 11/01/2018 14:41    ____________________________________________   PROCEDURES  Procedure(s) performed:   Procedures  CRITICAL CARE Performed by: Margette Fast Total critical care time: 35 minutes Critical care time was exclusive of separately billable procedures and treating other patients. Critical care was necessary to treat or prevent imminent or life-threatening deterioration. Critical care was time spent personally by me on the following activities: development of treatment plan with patient and/or surrogate as well as nursing, discussions with consultants, evaluation of patient's response to treatment, examination of patient, obtaining history from patient or surrogate, ordering and performing treatments and interventions, ordering and review of laboratory studies, ordering and review of radiographic studies, pulse oximetry and re-evaluation of patient's condition.  Nanda Quinton, MD Emergency Medicine  ____________________________________________   INITIAL IMPRESSION / ASSESSMENT AND PLAN / ED COURSE  Pertinent labs & imaging results that  were available during my care of the patient were reviewed by me and considered in my medical decision making (see chart for details).   Patient arrives through the emergency department entrance with seizure-like activity.  She had 2 additional brief episodes in the acute care area.  Of the patient was flopping and pulling at staff.  There was no apnea.  This activity was not consistent with generalized tonic-clonic seizure in my opinion.  Suspect nonepileptiform seizure.  Patient was given 2 mg of Ativan and immediately sat up and became conversational with no postictal phase.  She was able to provide history at that point.  Will obtain screening lab work and chest x-ray with her known Covid diagnosis.  I do not see indication for CT imaging of the head or loading AEDs at this time as the  patient has been compliant.  She admits that episodes of stress tend to bring about her "emotional seizures."   Labs with significant gap and low CO2. Patient continues to be without seizure like activity and awake after ativan. Continue IVF and will repeat chemistry. Possible acidosis related to seizure which I would expect to clear quickly. Care transferred to Dr. Criss Alvine.  ____________________________________________  FINAL CLINICAL IMPRESSION(S) / ED DIAGNOSES  Final diagnoses:  Nonepileptic episode (HCC)     MEDICATIONS GIVEN DURING THIS VISIT:  Medications  sodium chloride 0.9 % bolus 1,000 mL (0 mLs Intravenous Stopped 11/01/18 1552)  LORazepam (ATIVAN) injection 2 mg (2 mg Intravenous Given 11/01/18 1345)  LORazepam (ATIVAN) injection 1 mg (1 mg Intravenous Given 11/01/18 1420)     Note:  This document was prepared using Dragon voice recognition software and may include unintentional dictation errors.  Alona Bene, MD, [facility] Emergency Medicine    Jess Toney, Arlyss Repress, MD 11/01/18 1944

## 2018-11-01 NOTE — ED Notes (Signed)
Pt verbalized understanding of d/c instructions and has no further questions, VSS, NAD.  

## 2018-11-01 NOTE — ED Triage Notes (Addendum)
To ED via private vehicle with driver stating that she was having a seizure-- pt is combative, shaking on the bed- well known to staff-- is able to calm down with staff known to pt.  Dr. Laverta Baltimore at bedside   Pt is known covid positive - test done at an Urgent Care in [facility]

## 2018-11-04 DIAGNOSIS — Z20828 Contact with and (suspected) exposure to other viral communicable diseases: Secondary | ICD-10-CM | POA: Diagnosis not present

## 2018-11-04 DIAGNOSIS — R5381 Other malaise: Secondary | ICD-10-CM | POA: Diagnosis not present

## 2018-11-08 DIAGNOSIS — F4310 Post-traumatic stress disorder, unspecified: Secondary | ICD-10-CM | POA: Diagnosis not present

## 2018-11-08 DIAGNOSIS — F321 Major depressive disorder, single episode, moderate: Secondary | ICD-10-CM | POA: Diagnosis not present

## 2018-11-15 DIAGNOSIS — F4310 Post-traumatic stress disorder, unspecified: Secondary | ICD-10-CM | POA: Diagnosis not present

## 2018-11-20 DIAGNOSIS — F4310 Post-traumatic stress disorder, unspecified: Secondary | ICD-10-CM | POA: Diagnosis not present

## 2018-11-27 DIAGNOSIS — F4310 Post-traumatic stress disorder, unspecified: Secondary | ICD-10-CM | POA: Diagnosis not present

## 2018-12-06 DIAGNOSIS — F321 Major depressive disorder, single episode, moderate: Secondary | ICD-10-CM | POA: Diagnosis not present

## 2018-12-11 DIAGNOSIS — S20212A Contusion of left front wall of thorax, initial encounter: Secondary | ICD-10-CM | POA: Diagnosis not present

## 2018-12-11 DIAGNOSIS — S4992XA Unspecified injury of left shoulder and upper arm, initial encounter: Secondary | ICD-10-CM | POA: Diagnosis not present

## 2018-12-11 DIAGNOSIS — R079 Chest pain, unspecified: Secondary | ICD-10-CM | POA: Diagnosis not present

## 2018-12-11 DIAGNOSIS — M25512 Pain in left shoulder: Secondary | ICD-10-CM | POA: Diagnosis not present

## 2018-12-21 DIAGNOSIS — T2020XA Burn of second degree of head, face, and neck, unspecified site, initial encounter: Secondary | ICD-10-CM | POA: Diagnosis not present

## 2018-12-21 DIAGNOSIS — R000 Tachycardia, unspecified: Secondary | ICD-10-CM | POA: Diagnosis not present

## 2018-12-21 DIAGNOSIS — Z91040 Latex allergy status: Secondary | ICD-10-CM | POA: Diagnosis not present

## 2018-12-21 DIAGNOSIS — R05 Cough: Secondary | ICD-10-CM | POA: Diagnosis not present

## 2018-12-21 DIAGNOSIS — Z888 Allergy status to other drugs, medicaments and biological substances status: Secondary | ICD-10-CM | POA: Diagnosis not present

## 2018-12-21 DIAGNOSIS — R561 Post traumatic seizures: Secondary | ICD-10-CM | POA: Diagnosis not present

## 2018-12-21 DIAGNOSIS — R569 Unspecified convulsions: Secondary | ICD-10-CM | POA: Diagnosis not present

## 2018-12-21 DIAGNOSIS — Z79899 Other long term (current) drug therapy: Secondary | ICD-10-CM | POA: Diagnosis not present

## 2018-12-21 DIAGNOSIS — R519 Headache, unspecified: Secondary | ICD-10-CM | POA: Diagnosis not present

## 2018-12-21 DIAGNOSIS — G40909 Epilepsy, unspecified, not intractable, without status epilepticus: Secondary | ICD-10-CM | POA: Diagnosis not present

## 2018-12-21 DIAGNOSIS — Z91041 Radiographic dye allergy status: Secondary | ICD-10-CM | POA: Diagnosis not present

## 2018-12-22 DIAGNOSIS — Z79899 Other long term (current) drug therapy: Secondary | ICD-10-CM | POA: Diagnosis not present

## 2018-12-22 DIAGNOSIS — F4310 Post-traumatic stress disorder, unspecified: Secondary | ICD-10-CM | POA: Diagnosis not present

## 2018-12-22 DIAGNOSIS — F445 Conversion disorder with seizures or convulsions: Secondary | ICD-10-CM | POA: Diagnosis not present

## 2018-12-22 DIAGNOSIS — R569 Unspecified convulsions: Secondary | ICD-10-CM | POA: Diagnosis not present

## 2018-12-22 DIAGNOSIS — F419 Anxiety disorder, unspecified: Secondary | ICD-10-CM | POA: Diagnosis not present

## 2018-12-22 DIAGNOSIS — F329 Major depressive disorder, single episode, unspecified: Secondary | ICD-10-CM | POA: Diagnosis not present

## 2018-12-23 DIAGNOSIS — F329 Major depressive disorder, single episode, unspecified: Secondary | ICD-10-CM | POA: Diagnosis not present

## 2018-12-23 DIAGNOSIS — F445 Conversion disorder with seizures or convulsions: Secondary | ICD-10-CM | POA: Diagnosis not present

## 2018-12-23 DIAGNOSIS — R251 Tremor, unspecified: Secondary | ICD-10-CM | POA: Diagnosis not present

## 2018-12-27 DIAGNOSIS — F321 Major depressive disorder, single episode, moderate: Secondary | ICD-10-CM | POA: Diagnosis not present

## 2019-01-03 DIAGNOSIS — F321 Major depressive disorder, single episode, moderate: Secondary | ICD-10-CM | POA: Diagnosis not present

## 2019-01-04 DIAGNOSIS — F4310 Post-traumatic stress disorder, unspecified: Secondary | ICD-10-CM | POA: Diagnosis not present

## 2019-01-07 DIAGNOSIS — F321 Major depressive disorder, single episode, moderate: Secondary | ICD-10-CM | POA: Diagnosis not present

## 2019-01-08 DIAGNOSIS — F4310 Post-traumatic stress disorder, unspecified: Secondary | ICD-10-CM | POA: Diagnosis not present

## 2019-01-17 DIAGNOSIS — F321 Major depressive disorder, single episode, moderate: Secondary | ICD-10-CM | POA: Diagnosis not present

## 2019-01-23 ENCOUNTER — Other Ambulatory Visit: Payer: Self-pay

## 2019-01-23 ENCOUNTER — Encounter: Payer: Self-pay | Admitting: Allergy and Immunology

## 2019-01-23 ENCOUNTER — Ambulatory Visit (INDEPENDENT_AMBULATORY_CARE_PROVIDER_SITE_OTHER): Payer: PPO | Admitting: Allergy and Immunology

## 2019-01-23 VITALS — BP 126/76 | HR 100 | Temp 98.9°F | Resp 18 | Ht 66.7 in | Wt 223.2 lb

## 2019-01-23 DIAGNOSIS — J4530 Mild persistent asthma, uncomplicated: Secondary | ICD-10-CM | POA: Diagnosis not present

## 2019-01-23 DIAGNOSIS — J3089 Other allergic rhinitis: Secondary | ICD-10-CM

## 2019-01-23 DIAGNOSIS — G43909 Migraine, unspecified, not intractable, without status migrainosus: Secondary | ICD-10-CM | POA: Diagnosis not present

## 2019-01-23 MED ORDER — FLOVENT HFA 44 MCG/ACT IN AERO: INHALATION_SPRAY | RESPIRATORY_TRACT | 5 refills | Status: AC

## 2019-01-23 NOTE — Patient Instructions (Addendum)
  1.  Avoidance measures -nitrates, caffeine, house dust mite  2.  Treat and prevent inflammation:   A.  Flovent 44 -2 inhalations twice a day with spacer  B.  OTC Nasacort -1 spray each nostril twice a day  3.  If needed:   A. Albuterol HFA - 2 inhalations every 4-6 hours  B. Nasal saline  4. Return to clinic in 4 weeks or earlier if problem

## 2019-01-23 NOTE — Progress Notes (Signed)
[place] [facility] [place] [facility] [place]   NEW PATIENT NOTE  Referring Provider: No ref. provider found Primary Provider: De Blanch, FNP Date of office visit: 01/23/2019    Subjective:   Chief Complaint:  Krista Young (DOB: [birth year]) is a 54 y.o. female who presents to the clinic on 01/23/2019 with a chief complaint of Food allergy and Breathing Problem .  HPI: Krista Young presents to this clinic in evaluation of 3 main issues.  First, she was told that she has a food allergy based upon a blood test that was performed in the 1930s which identified antibodies against pork and corn.  She does not have any adverse effect when eating pork unless she eats a large amount of pork.  If she eats a large amount of pork she gets a headache and vomits.  She has eaten corn in the past with no problem at all and still continues to eat small amounts of corn with no problem.  Second, she does have a headache/seizure disorder.  Apparently with intermittent frequency she will develop a whole head pounding pain associated with blurriness and then she has a seizure with disorientation the last about 30 to 60 minutes.  She is on multiple seizure medications at this point in time.  She consumes 2 cups of coffee per day usually 1 in the morning and 1 in the afternoon.  Third, she had Covid infection in October presenting with fever and cough and anosmia that lasted 2 weeks.  Since that point in time she has been left with shortness of breath.  It is hard for her to get a deep breath.  She can not exert herself very well when walking.  She has been given an albuterol MDI and when she uses his inhaler it may help somewhat.  She does not have respiratory tract symptoms that disturb her sleep.  As well, she has been having nasal congestion especially in the morning.  She can taste and smell.  As well, she notes that since her Covid infection her hair does not grow as fast as it  did previously.  Past Medical History:  Diagnosis Date  . Anxiety   . Depression   . Endometriosis   . History of alcoholism (HCC)   . History of COVID-19   . Hyperlipidemia   . Hypothyroidism   . Myocardial infarction (HCC)   . Obstructive sleep apnea   . Posttraumatic stress disorder   . Seizures (HCC)     Past Surgical History:  Procedure Laterality Date  . ABDOMINAL SURGERY     laproscopic sx for endometriosis  . HAND SURGERY Left 2017  . NASAL SINUS SURGERY  1992;2000;2009    Allergies as of 01/23/2019      Reactions   Bee Venom Shortness Of Breath, Rash   Welts   Copper-containing Compounds Hives   Ivp Dye [iodinated Diagnostic Agents] Swelling, Other (See Comments)   Sweating, Trouble breathing   Latex Itching, Rash, Other (See Comments)   (And) feels like her "body is on fire"   Tape Itching, Rash   Paper tape is ok      Medication List      albuterol 108 (90 Base) MCG/ACT inhaler Commonly known as: VENTOLIN HFA   gabapentin 300 MG capsule Commonly known as: NEURONTIN Take 300 mg by mouth daily.   hydrOXYzine 25 MG tablet Commonly known as: ATARAX/VISTARIL Take 25 mg by mouth 3 (three) times daily.  lamoTRIgine 150 MG tablet Commonly known as: LAMICTAL Take 150 mg by mouth 2 (two) times daily.   sertraline 100 MG tablet Commonly known as: ZOLOFT Take 100 mg by mouth daily.   topiramate 50 MG tablet Commonly known as: TOPAMAX Take 50 mg by mouth at bedtime.   traZODone 50 MG tablet Commonly known as: DESYREL Take 50 mg by mouth at bedtime as needed.       Review of systems negative except as noted in HPI / PMHx or noted below:  Review of Systems  Constitutional: Negative.   HENT: Negative.   Eyes: Negative.   Respiratory: Negative.   Cardiovascular: Negative.   Gastrointestinal: Negative.   Genitourinary: Negative.   Musculoskeletal: Negative.   Skin: Negative.   Neurological: Negative.   Endo/Heme/Allergies: Negative.     Psychiatric/Behavioral: Negative.     Family History  Problem Relation Age of Onset  . Alcohol abuse Mother   . Hypertension Mother   . Hyperlipidemia Mother   . Stroke Mother   . Heart disease Mother   . Seizures Mother   . COPD Mother   . Diabetes Mother   . Alcohol abuse Father   . Heart disease Maternal Aunt   . Diabetes Maternal Aunt   . Hypertension Maternal Aunt   . Cancer Maternal Uncle   . Heart disease Maternal Grandmother   . Hypertension Sister   . Depression Sister     Social History   Socioeconomic History  . Marital status: Single    Spouse name: Not on file  . Number of children: Not on file  . Years of education: Not on file  . Highest education level: Not on file  Occupational History  . Not on file  Tobacco Use  . Smoking status: Never Smoker  . Smokeless tobacco: Never Used  Substance and Sexual Activity  . Alcohol use: Not Currently  . Drug use: No  . Sexual activity: Yes    Comment: same sex relationship  Other Topics Concern  . Not on file  Social History Narrative  . Not on file   Social Determinants of Health   Financial Resource Strain:   . Difficulty of Paying Living Expenses: Not on file  Food Insecurity:   . Worried About Charity fundraiser in the Last Year: Not on file  . Ran Out of Food in the Last Year: Not on file  Transportation Needs:   . Lack of Transportation (Medical): Not on file  . Lack of Transportation (Non-Medical): Not on file  Physical Activity:   . Days of Exercise per Week: Not on file  . Minutes of Exercise per Session: Not on file  Stress:   . Feeling of Stress : Not on file  Social Connections:   . Frequency of Communication with Friends and Family: Not on file  . Frequency of Social Gatherings with Friends and Family: Not on file  . Attends Religious Services: Not on file  . Active Member of Clubs or Organizations: Not on file  . Attends Archivist Meetings: Not on file  . Marital  Status: Not on file  Intimate Partner Violence:   . Fear of Current or Ex-Partner: Not on file  . Emotionally Abused: Not on file  . Physically Abused: Not on file  . Sexually Abused: Not on file    Environmental and Social history  Lives in a house with a dry environment, a dog and cat located inside the household, carpet in the  bedroom, plastic on the bed, plastic on the pillow, no smoking ongoing with inside the household.  She does not work secondary to disability.  Objective:   Vitals:   01/23/19 0926  BP: 126/76  Pulse: 100  Resp: 18  Temp: 98.9 F (37.2 C)  SpO2: 96%   Height: 5' 6.7" (169.4 cm) Weight: 223 lb 3.2 oz (101.2 kg)  Physical Exam Constitutional:      Appearance: She is not diaphoretic.  HENT:     Head: Normocephalic.     Right Ear: Tympanic membrane, ear canal and external ear normal.     Left Ear: Tympanic membrane, ear canal and external ear normal.     Nose: Nose normal. No mucosal edema or rhinorrhea.     Mouth/Throat:     Pharynx: Uvula midline. No oropharyngeal exudate.  Eyes:     Conjunctiva/sclera: Conjunctivae normal.  Neck:     Thyroid: No thyromegaly.     Trachea: Trachea normal. No tracheal tenderness or tracheal deviation.  Cardiovascular:     Rate and Rhythm: Normal rate and regular rhythm.     Heart sounds: Normal heart sounds, S1 normal and S2 normal. No murmur.  Pulmonary:     Effort: No respiratory distress.     Breath sounds: Normal breath sounds. No stridor. No wheezing or rales.  Lymphadenopathy:     Head:     Right side of head: No tonsillar adenopathy.     Left side of head: No tonsillar adenopathy.     Cervical: No cervical adenopathy.  Skin:    Findings: No erythema or rash.     Nails: There is no clubbing.  Neurological:     Mental Status: She is alert.     Diagnostics: Allergy skin tests were not performed secondary to the recent use of hydroxyzine.   Spirometry was performed and demonstrated an FEV1 of 2.57  @ 85 % of predicted. FEV1/FVC = 0.77  Oxygen saturation was 96% on room air at rest.  Oxygen saturation was 96% on room air during walking the hallway.   Assessment and Plan:    1. Asthma, well controlled, mild persistent   2. Other allergic rhinitis   3. Migraine syndrome     1.  Avoidance measures -nitrates, caffeine, house dust mite  2.  Treat and prevent inflammation:   A.  Flovent 44 -2 inhalations twice a day with spacer  B.  OTC Nasacort -1 spray each nostril twice a day  3.  If needed:   A. Albuterol HFA - 2 inhalations every 4-6 hours  B. Nasal saline  4. Return to clinic in 4 weeks or earlier if problem  Josphine will utilize anti-inflammatory agents for both her upper and lower airway and have her perform house dust mite avoidance measures assuming that there is a component of inflammation contributing to her respiratory tract symptoms and we will see what type of effect we get over the course of the next 4 weeks or so.  She also appears to have migraine headaches tied up with a seizure disorder and I have asked her to consolidate her caffeine consumption as much as possible as this is probably placing her brain in a state where it is easy to fire off headaches and of course remain away from nitrate consumption which appears to be a trigger for her migraine.  Laurette Schimke, MD Allergy / Immunology [facility]

## 2019-01-24 ENCOUNTER — Encounter: Payer: Self-pay | Admitting: Allergy and Immunology

## 2019-01-24 DIAGNOSIS — F4310 Post-traumatic stress disorder, unspecified: Secondary | ICD-10-CM | POA: Diagnosis not present

## 2019-01-31 DIAGNOSIS — F321 Major depressive disorder, single episode, moderate: Secondary | ICD-10-CM | POA: Diagnosis not present

## 2019-02-11 DIAGNOSIS — Z7689 Persons encountering health services in other specified circumstances: Secondary | ICD-10-CM | POA: Diagnosis not present

## 2019-02-11 DIAGNOSIS — F331 Major depressive disorder, recurrent, moderate: Secondary | ICD-10-CM | POA: Diagnosis not present

## 2019-02-11 DIAGNOSIS — G40909 Epilepsy, unspecified, not intractable, without status epilepticus: Secondary | ICD-10-CM | POA: Diagnosis not present

## 2019-02-14 DIAGNOSIS — F4310 Post-traumatic stress disorder, unspecified: Secondary | ICD-10-CM | POA: Diagnosis not present

## 2019-02-20 ENCOUNTER — Ambulatory Visit: Payer: PPO | Admitting: Allergy and Immunology

## 2019-02-21 DIAGNOSIS — F411 Generalized anxiety disorder: Secondary | ICD-10-CM | POA: Diagnosis not present

## 2019-02-28 DIAGNOSIS — F4310 Post-traumatic stress disorder, unspecified: Secondary | ICD-10-CM | POA: Diagnosis not present

## 2019-03-01 DIAGNOSIS — F411 Generalized anxiety disorder: Secondary | ICD-10-CM | POA: Diagnosis not present

## 2019-03-02 DIAGNOSIS — F4310 Post-traumatic stress disorder, unspecified: Secondary | ICD-10-CM | POA: Diagnosis not present

## 2019-03-02 DIAGNOSIS — R457 State of emotional shock and stress, unspecified: Secondary | ICD-10-CM | POA: Diagnosis not present

## 2019-03-02 DIAGNOSIS — F411 Generalized anxiety disorder: Secondary | ICD-10-CM | POA: Diagnosis not present

## 2019-03-02 DIAGNOSIS — R569 Unspecified convulsions: Secondary | ICD-10-CM | POA: Diagnosis not present

## 2019-03-02 DIAGNOSIS — R456 Violent behavior: Secondary | ICD-10-CM | POA: Diagnosis not present

## 2019-03-02 DIAGNOSIS — F331 Major depressive disorder, recurrent, moderate: Secondary | ICD-10-CM | POA: Diagnosis not present

## 2019-03-02 DIAGNOSIS — F29 Unspecified psychosis not due to a substance or known physiological condition: Secondary | ICD-10-CM | POA: Diagnosis not present

## 2019-03-14 DIAGNOSIS — F4310 Post-traumatic stress disorder, unspecified: Secondary | ICD-10-CM | POA: Diagnosis not present

## 2019-03-20 DIAGNOSIS — M7711 Lateral epicondylitis, right elbow: Secondary | ICD-10-CM | POA: Diagnosis not present

## 2019-03-21 DIAGNOSIS — F4310 Post-traumatic stress disorder, unspecified: Secondary | ICD-10-CM | POA: Diagnosis not present

## 2019-03-28 DIAGNOSIS — F411 Generalized anxiety disorder: Secondary | ICD-10-CM | POA: Diagnosis not present

## 2019-04-04 DIAGNOSIS — F321 Major depressive disorder, single episode, moderate: Secondary | ICD-10-CM | POA: Diagnosis not present

## 2019-04-11 DIAGNOSIS — F321 Major depressive disorder, single episode, moderate: Secondary | ICD-10-CM | POA: Diagnosis not present

## 2019-04-12 DIAGNOSIS — R531 Weakness: Secondary | ICD-10-CM | POA: Diagnosis not present

## 2019-04-12 DIAGNOSIS — R5383 Other fatigue: Secondary | ICD-10-CM | POA: Diagnosis not present

## 2019-04-12 DIAGNOSIS — R569 Unspecified convulsions: Secondary | ICD-10-CM | POA: Diagnosis not present

## 2019-04-12 DIAGNOSIS — G4089 Other seizures: Secondary | ICD-10-CM | POA: Diagnosis not present

## 2019-04-12 DIAGNOSIS — R0602 Shortness of breath: Secondary | ICD-10-CM | POA: Diagnosis not present

## 2019-04-15 DIAGNOSIS — R569 Unspecified convulsions: Secondary | ICD-10-CM | POA: Diagnosis not present

## 2019-04-15 DIAGNOSIS — M7711 Lateral epicondylitis, right elbow: Secondary | ICD-10-CM | POA: Diagnosis not present

## 2019-04-15 DIAGNOSIS — R456 Violent behavior: Secondary | ICD-10-CM | POA: Diagnosis not present

## 2019-04-15 DIAGNOSIS — F29 Unspecified psychosis not due to a substance or known physiological condition: Secondary | ICD-10-CM | POA: Diagnosis not present

## 2019-04-15 DIAGNOSIS — M7581 Other shoulder lesions, right shoulder: Secondary | ICD-10-CM | POA: Diagnosis not present

## 2019-04-17 DIAGNOSIS — F4310 Post-traumatic stress disorder, unspecified: Secondary | ICD-10-CM | POA: Diagnosis not present

## 2019-04-18 DIAGNOSIS — Z6833 Body mass index (BMI) 33.0-33.9, adult: Secondary | ICD-10-CM | POA: Diagnosis not present

## 2019-04-18 DIAGNOSIS — N644 Mastodynia: Secondary | ICD-10-CM | POA: Diagnosis not present

## 2019-04-22 DIAGNOSIS — R000 Tachycardia, unspecified: Secondary | ICD-10-CM | POA: Diagnosis not present

## 2019-04-22 DIAGNOSIS — R410 Disorientation, unspecified: Secondary | ICD-10-CM | POA: Diagnosis not present

## 2019-04-22 DIAGNOSIS — R456 Violent behavior: Secondary | ICD-10-CM | POA: Diagnosis not present

## 2019-04-22 DIAGNOSIS — R569 Unspecified convulsions: Secondary | ICD-10-CM | POA: Diagnosis not present

## 2019-04-22 DIAGNOSIS — F445 Conversion disorder with seizures or convulsions: Secondary | ICD-10-CM | POA: Diagnosis not present

## 2019-04-22 DIAGNOSIS — F419 Anxiety disorder, unspecified: Secondary | ICD-10-CM | POA: Diagnosis not present

## 2019-04-23 DIAGNOSIS — Z124 Encounter for screening for malignant neoplasm of cervix: Secondary | ICD-10-CM | POA: Diagnosis not present

## 2019-04-25 DIAGNOSIS — F4310 Post-traumatic stress disorder, unspecified: Secondary | ICD-10-CM | POA: Diagnosis not present

## 2019-04-26 DIAGNOSIS — N644 Mastodynia: Secondary | ICD-10-CM | POA: Diagnosis not present

## 2019-04-26 DIAGNOSIS — R928 Other abnormal and inconclusive findings on diagnostic imaging of breast: Secondary | ICD-10-CM | POA: Diagnosis not present

## 2019-05-02 DIAGNOSIS — F4310 Post-traumatic stress disorder, unspecified: Secondary | ICD-10-CM | POA: Diagnosis not present

## 2019-05-09 DIAGNOSIS — F321 Major depressive disorder, single episode, moderate: Secondary | ICD-10-CM | POA: Diagnosis not present

## 2019-05-20 DIAGNOSIS — J0100 Acute maxillary sinusitis, unspecified: Secondary | ICD-10-CM | POA: Diagnosis not present

## 2019-05-20 DIAGNOSIS — R0981 Nasal congestion: Secondary | ICD-10-CM | POA: Diagnosis not present

## 2019-05-20 DIAGNOSIS — R21 Rash and other nonspecific skin eruption: Secondary | ICD-10-CM | POA: Diagnosis not present

## 2019-05-20 DIAGNOSIS — Z20828 Contact with and (suspected) exposure to other viral communicable diseases: Secondary | ICD-10-CM | POA: Diagnosis not present

## 2019-05-23 DIAGNOSIS — F321 Major depressive disorder, single episode, moderate: Secondary | ICD-10-CM | POA: Diagnosis not present

## 2019-05-23 DIAGNOSIS — F4310 Post-traumatic stress disorder, unspecified: Secondary | ICD-10-CM | POA: Diagnosis not present

## 2019-05-28 DIAGNOSIS — R569 Unspecified convulsions: Secondary | ICD-10-CM | POA: Diagnosis not present

## 2019-05-28 DIAGNOSIS — R110 Nausea: Secondary | ICD-10-CM | POA: Diagnosis not present

## 2019-05-28 DIAGNOSIS — R456 Violent behavior: Secondary | ICD-10-CM | POA: Diagnosis not present

## 2019-05-28 DIAGNOSIS — G4489 Other headache syndrome: Secondary | ICD-10-CM | POA: Diagnosis not present

## 2019-05-28 DIAGNOSIS — F445 Conversion disorder with seizures or convulsions: Secondary | ICD-10-CM | POA: Diagnosis not present

## 2019-05-28 DIAGNOSIS — R457 State of emotional shock and stress, unspecified: Secondary | ICD-10-CM | POA: Diagnosis not present

## 2019-05-29 DIAGNOSIS — M25511 Pain in right shoulder: Secondary | ICD-10-CM | POA: Diagnosis not present

## 2019-05-29 DIAGNOSIS — X58XXXA Exposure to other specified factors, initial encounter: Secondary | ICD-10-CM | POA: Diagnosis not present

## 2019-05-29 DIAGNOSIS — S46811A Strain of other muscles, fascia and tendons at shoulder and upper arm level, right arm, initial encounter: Secondary | ICD-10-CM | POA: Diagnosis not present

## 2019-05-30 DIAGNOSIS — Z79899 Other long term (current) drug therapy: Secondary | ICD-10-CM | POA: Diagnosis not present

## 2019-05-30 DIAGNOSIS — S0083XA Contusion of other part of head, initial encounter: Secondary | ICD-10-CM | POA: Diagnosis not present

## 2019-05-30 DIAGNOSIS — F4310 Post-traumatic stress disorder, unspecified: Secondary | ICD-10-CM | POA: Diagnosis not present

## 2019-05-30 DIAGNOSIS — R220 Localized swelling, mass and lump, head: Secondary | ICD-10-CM | POA: Diagnosis not present

## 2019-05-30 DIAGNOSIS — F329 Major depressive disorder, single episode, unspecified: Secondary | ICD-10-CM | POA: Diagnosis not present

## 2019-05-30 DIAGNOSIS — R569 Unspecified convulsions: Secondary | ICD-10-CM | POA: Diagnosis not present

## 2019-05-30 DIAGNOSIS — I252 Old myocardial infarction: Secondary | ICD-10-CM | POA: Diagnosis not present

## 2019-05-30 DIAGNOSIS — R55 Syncope and collapse: Secondary | ICD-10-CM | POA: Diagnosis not present

## 2019-05-30 DIAGNOSIS — Z7982 Long term (current) use of aspirin: Secondary | ICD-10-CM | POA: Diagnosis not present

## 2019-05-30 DIAGNOSIS — G44319 Acute post-traumatic headache, not intractable: Secondary | ICD-10-CM | POA: Diagnosis not present

## 2019-05-30 DIAGNOSIS — F419 Anxiety disorder, unspecified: Secondary | ICD-10-CM | POA: Diagnosis not present

## 2019-06-04 DIAGNOSIS — F4310 Post-traumatic stress disorder, unspecified: Secondary | ICD-10-CM | POA: Diagnosis not present

## 2019-06-06 DIAGNOSIS — F321 Major depressive disorder, single episode, moderate: Secondary | ICD-10-CM | POA: Diagnosis not present

## 2019-06-13 DIAGNOSIS — F321 Major depressive disorder, single episode, moderate: Secondary | ICD-10-CM | POA: Diagnosis not present

## 2019-06-20 DIAGNOSIS — F4310 Post-traumatic stress disorder, unspecified: Secondary | ICD-10-CM | POA: Diagnosis not present

## 2019-06-21 DIAGNOSIS — R509 Fever, unspecified: Secondary | ICD-10-CM | POA: Diagnosis not present

## 2019-06-21 DIAGNOSIS — R569 Unspecified convulsions: Secondary | ICD-10-CM | POA: Diagnosis not present

## 2019-06-21 DIAGNOSIS — Z6833 Body mass index (BMI) 33.0-33.9, adult: Secondary | ICD-10-CM | POA: Diagnosis not present

## 2019-06-21 DIAGNOSIS — I252 Old myocardial infarction: Secondary | ICD-10-CM | POA: Diagnosis not present

## 2019-06-21 DIAGNOSIS — Z91018 Allergy to other foods: Secondary | ICD-10-CM | POA: Diagnosis not present

## 2019-06-21 DIAGNOSIS — F445 Conversion disorder with seizures or convulsions: Secondary | ICD-10-CM | POA: Diagnosis not present

## 2019-06-21 DIAGNOSIS — H6691 Otitis media, unspecified, right ear: Secondary | ICD-10-CM | POA: Diagnosis not present

## 2019-06-21 DIAGNOSIS — Z8616 Personal history of COVID-19: Secondary | ICD-10-CM | POA: Diagnosis not present

## 2019-06-21 DIAGNOSIS — M791 Myalgia, unspecified site: Secondary | ICD-10-CM | POA: Diagnosis not present

## 2019-06-21 DIAGNOSIS — Z79899 Other long term (current) drug therapy: Secondary | ICD-10-CM | POA: Diagnosis not present

## 2019-06-21 DIAGNOSIS — F329 Major depressive disorder, single episode, unspecified: Secondary | ICD-10-CM | POA: Diagnosis not present

## 2019-06-21 DIAGNOSIS — F419 Anxiety disorder, unspecified: Secondary | ICD-10-CM | POA: Diagnosis not present

## 2019-06-21 DIAGNOSIS — F4310 Post-traumatic stress disorder, unspecified: Secondary | ICD-10-CM | POA: Diagnosis not present

## 2019-06-21 DIAGNOSIS — Z7982 Long term (current) use of aspirin: Secondary | ICD-10-CM | POA: Diagnosis not present

## 2019-06-26 DIAGNOSIS — F321 Major depressive disorder, single episode, moderate: Secondary | ICD-10-CM | POA: Diagnosis not present

## 2019-06-27 DIAGNOSIS — F321 Major depressive disorder, single episode, moderate: Secondary | ICD-10-CM | POA: Diagnosis not present

## 2019-07-02 DIAGNOSIS — J301 Allergic rhinitis due to pollen: Secondary | ICD-10-CM | POA: Diagnosis not present

## 2019-07-02 DIAGNOSIS — G4733 Obstructive sleep apnea (adult) (pediatric): Secondary | ICD-10-CM | POA: Diagnosis not present

## 2019-07-02 DIAGNOSIS — J4530 Mild persistent asthma, uncomplicated: Secondary | ICD-10-CM | POA: Diagnosis not present

## 2019-07-02 DIAGNOSIS — R5383 Other fatigue: Secondary | ICD-10-CM | POA: Diagnosis not present

## 2019-07-04 DIAGNOSIS — F411 Generalized anxiety disorder: Secondary | ICD-10-CM | POA: Diagnosis not present

## 2019-07-06 DIAGNOSIS — G4733 Obstructive sleep apnea (adult) (pediatric): Secondary | ICD-10-CM | POA: Diagnosis not present

## 2019-07-12 ENCOUNTER — Emergency Department (HOSPITAL_COMMUNITY)
Admission: EM | Admit: 2019-07-12 | Discharge: 2019-07-13 | Disposition: A | Discharge status: Home / Self Care | Payer: PPO | Attending: Emergency Medicine | Admitting: Emergency Medicine

## 2019-07-12 ENCOUNTER — Encounter (HOSPITAL_COMMUNITY): Payer: Self-pay | Admitting: Emergency Medicine

## 2019-07-12 ENCOUNTER — Other Ambulatory Visit: Payer: Self-pay

## 2019-07-12 DIAGNOSIS — F332 Major depressive disorder, recurrent severe without psychotic features: Secondary | ICD-10-CM | POA: Insufficient documentation

## 2019-07-12 DIAGNOSIS — Z91040 Latex allergy status: Secondary | ICD-10-CM | POA: Diagnosis not present

## 2019-07-12 DIAGNOSIS — Z79899 Other long term (current) drug therapy: Secondary | ICD-10-CM | POA: Diagnosis not present

## 2019-07-12 DIAGNOSIS — Z03818 Encounter for observation for suspected exposure to other biological agents ruled out: Secondary | ICD-10-CM | POA: Diagnosis not present

## 2019-07-12 DIAGNOSIS — F419 Anxiety disorder, unspecified: Secondary | ICD-10-CM

## 2019-07-12 DIAGNOSIS — R569 Unspecified convulsions: Secondary | ICD-10-CM | POA: Insufficient documentation

## 2019-07-12 DIAGNOSIS — R45851 Suicidal ideations: Secondary | ICD-10-CM | POA: Insufficient documentation

## 2019-07-12 DIAGNOSIS — F4310 Post-traumatic stress disorder, unspecified: Secondary | ICD-10-CM | POA: Diagnosis not present

## 2019-07-12 DIAGNOSIS — Z20822 Contact with and (suspected) exposure to covid-19: Secondary | ICD-10-CM | POA: Diagnosis not present

## 2019-07-12 DIAGNOSIS — E039 Hypothyroidism, unspecified: Secondary | ICD-10-CM | POA: Diagnosis not present

## 2019-07-12 LAB — COMPREHENSIVE METABOLIC PANEL
ALT: 22 U/L (ref 0–44)
AST: 22 U/L (ref 15–41)
Albumin: 4.1 g/dL (ref 3.5–5.0)
Alkaline Phosphatase: 79 U/L (ref 38–126)
Anion gap: 12 (ref 5–15)
BUN: 11 mg/dL (ref 6–20)
CO2: 22 mmol/L (ref 22–32)
Calcium: 9.1 mg/dL (ref 8.9–10.3)
Chloride: 105 mmol/L (ref 98–111)
Creatinine, Ser: 0.98 mg/dL (ref 0.44–1.00)
GFR calc Af Amer: >60 mL/min (ref >60)
GFR calc non Af Amer: >60 mL/min (ref >60)
Glucose, Bld: 99 mg/dL (ref 70–99)
Potassium: 3.3 mmol/L — ABNORMAL LOW (ref 3.5–5.1)
Sodium: 139 mmol/L (ref 135–145)
Total Bilirubin: 0.3 mg/dL (ref 0.3–1.2)
Total Protein: 7.7 g/dL (ref 6.5–8.1)

## 2019-07-12 LAB — RAPID URINE DRUG SCREEN, HOSP PERFORMED
Amphetamines: NOT DETECTED
Barbiturates: NOT DETECTED
Benzodiazepines: NOT DETECTED
Cocaine: NOT DETECTED
Opiates: NOT DETECTED
Tetrahydrocannabinol: NOT DETECTED

## 2019-07-12 LAB — CBC
HCT: 43.9 % (ref 36.0–46.0)
Hemoglobin: 13.8 g/dL (ref 12.0–15.0)
MCH: 30 pg (ref 26.0–34.0)
MCHC: 31.4 g/dL (ref 30.0–36.0)
MCV: 95.4 fL (ref 80.0–100.0)
Platelets: 353 10*3/uL (ref 150–400)
RBC: 4.6 MIL/uL (ref 3.87–5.11)
RDW: 13.7 % (ref 11.5–15.5)
WBC: 8.7 10*3/uL (ref 4.0–10.5)
nRBC: 0 % (ref 0.0–0.2)

## 2019-07-12 LAB — SALICYLATE LEVEL: Salicylate Lvl: <7 mg/dL — ABNORMAL LOW (ref 7.0–30.0)

## 2019-07-12 LAB — ACETAMINOPHEN LEVEL: Acetaminophen (Tylenol), Serum: <10 ug/mL — ABNORMAL LOW (ref 10–30)

## 2019-07-12 LAB — SARS CORONAVIRUS 2 BY RT PCR (HOSPITAL ORDER, PERFORMED IN ~~LOC~~ HOSPITAL LAB): SARS Coronavirus 2: NEGATIVE

## 2019-07-12 LAB — ETHANOL: Alcohol, Ethyl (B): <10 mg/dL (ref <10)

## 2019-07-12 MED ORDER — FLUTICASONE FUROATE-VILANTEROL 100-25 MCG/INH IN AEPB
1.0000 | INHALATION_SPRAY | Freq: Every day | RESPIRATORY_TRACT | Status: DC
  Filled 2019-07-12: qty 28

## 2019-07-12 MED ORDER — TRAZODONE HCL 50 MG PO TABS: 75.0000 mg | ORAL_TABLET | Freq: Every evening | ORAL | Status: DC | PRN

## 2019-07-12 MED ORDER — TOPIRAMATE 25 MG PO TABS
75.0000 mg | ORAL_TABLET | Freq: Every day | ORAL | Status: DC
  Administered 2019-07-12: 75 mg via ORAL
  Filled 2019-07-12: qty 3

## 2019-07-12 MED ORDER — KETOROLAC TROMETHAMINE 15 MG/ML IJ SOLN
15.0000 mg | Freq: Once | INTRAMUSCULAR | Status: AC
  Administered 2019-07-12: 15 mg via INTRAVENOUS
  Filled 2019-07-12: qty 1

## 2019-07-12 MED ORDER — PALIPERIDONE ER 6 MG PO TB24
6.0000 mg | ORAL_TABLET | Freq: Every morning | ORAL | Status: DC
  Filled 2019-07-12: qty 1

## 2019-07-12 MED ORDER — LORAZEPAM 0.5 MG PO TABS
0.5000 mg | ORAL_TABLET | Freq: Once | ORAL | Status: AC
  Administered 2019-07-12: 0.5 mg via ORAL
  Filled 2019-07-12: qty 1

## 2019-07-12 MED ORDER — DULOXETINE HCL 30 MG PO CPEP
30.0000 mg | ORAL_CAPSULE | Freq: Every day | ORAL | Status: DC
  Administered 2019-07-12: 30 mg via ORAL
  Filled 2019-07-12: qty 1

## 2019-07-12 MED ORDER — LORAZEPAM 0.5 MG PO TABS: 0.5000 mg | ORAL_TABLET | ORAL | Status: DC

## 2019-07-12 MED ORDER — DULOXETINE HCL 60 MG PO CPEP
60.0000 mg | ORAL_CAPSULE | Freq: Every morning | ORAL | Status: DC
  Administered 2019-07-13: 60 mg via ORAL
  Filled 2019-07-12: qty 1

## 2019-07-12 MED ORDER — LAMOTRIGINE 25 MG PO TABS
225.0000 mg | ORAL_TABLET | Freq: Two times a day (BID) | ORAL | Status: DC
  Administered 2019-07-12 – 2019-07-13 (×2): 225 mg via ORAL
  Filled 2019-07-12 (×2): qty 2

## 2019-07-12 MED ORDER — LORAZEPAM 2 MG/ML IJ SOLN
1.0000 mg | Freq: Once | INTRAMUSCULAR | Status: AC
  Administered 2019-07-12: 1 mg via INTRAVENOUS
  Filled 2019-07-12: qty 1

## 2019-07-12 MED ORDER — GABAPENTIN 400 MG PO CAPS
400.0000 mg | ORAL_CAPSULE | Freq: Every day | ORAL | Status: DC
  Administered 2019-07-12: 400 mg via ORAL
  Filled 2019-07-12: qty 1

## 2019-07-12 MED ORDER — HYDROXYZINE HCL 10 MG PO TABS
10.0000 mg | ORAL_TABLET | Freq: Three times a day (TID) | ORAL | Status: DC | PRN
  Filled 2019-07-12: qty 1

## 2019-07-12 NOTE — ED Notes (Addendum)
Pt changed into purple scrubs. Security notifed for wanding. Staffing notifed for sitter. Pt's belongings placed in [place] F locker #11, not inventoried at this time. Pt in triage waiting for room assignment.

## 2019-07-12 NOTE — ED Notes (Signed)
Dinner Tray Ordered @ 1817. 
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
## 2019-07-12 NOTE — BH Assessment (Signed)
Clinician attempted to call pt's nurse several times however the call continued to roll over the secretary. Clinician to move to the next assessment in the que. Clinician to check back.     Redmond Pulling, MS, [facility], [facility] Triage Specialist [phone].
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
## 2019-07-13 ENCOUNTER — Encounter (HOSPITAL_COMMUNITY): Payer: Self-pay | Admitting: *Deleted

## 2019-07-13 DIAGNOSIS — F4310 Post-traumatic stress disorder, unspecified: Secondary | ICD-10-CM | POA: Diagnosis not present

## 2019-07-13 DIAGNOSIS — F332 Major depressive disorder, recurrent severe without psychotic features: Secondary | ICD-10-CM | POA: Diagnosis not present

## 2019-07-13 NOTE — ED Notes (Signed)
Breakfast Ordered 

## 2019-07-13 NOTE — BHH Counselor (Signed)
Per Rutha Bouchard, RN no appropriate beds available. TTS to seek placement.    Redmond Pulling, MS, [facility], [facility] Triage Specialist [phone]

## 2019-07-13 NOTE — Discharge Instructions (Signed)
Follow up with [facility].  To the emergency department if any concerning signs or symptoms develop.
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
## 2019-07-13 NOTE — ED Notes (Signed)
Lunch was ordered by Shia Eber 

## 2019-07-13 NOTE — ED Provider Notes (Addendum)
Emergency Medicine Observation Re-evaluation Note  Krista Young is a 54 y.o. female, seen on rounds today.  Pt initially presented to the ED for complaints of Post-Traumatic Stress Disorder, Seizures, and Suicidal Currently, the patient is awaiting psychiatric assessment.    Physical Exam  BP 118/66 (BP Location: Left Arm)   Pulse 89   Temp 97.9 F (36.6 C) (Oral)   Resp 18   Ht 5\' 8"  (1.727 m)   Wt 103.4 kg   LMP 07/06/2011   SpO2 100%   BMI 34.67 kg/m  Physical Exam Vitals and nursing note reviewed.  Constitutional:      General: She is not in acute distress.    Appearance: She is well-developed.     Comments: Resting in bed, eating breakfast.   HENT:     Head: Normocephalic and atraumatic.  Eyes:     General:        Right eye: Young discharge.        Left eye: Young discharge.  Neck:     Vascular: Young JVD.     Trachea: Young tracheal deviation.  Cardiovascular:     Rate and Rhythm: Normal rate.  Pulmonary:     Effort: Pulmonary effort is normal.  Abdominal:     General: There is Young distension.  Skin:    Findings: Young erythema.  Neurological:     Mental Status: She is alert.  Psychiatric:        Behavior: Behavior normal.     ED Course / MDM   I have reviewed the labs performed to date as well as medications administered while in observation.  Recent changes in the last 24 hours include none.  Plan  Current plan is for psych evaluation, possible psych placement. Patient is not under full IVC at this time.  12:22PM Spoke with 07/08/2011, NP with psychiatric team.  Patient is psych cleared at this time. Will be discharged home with outpatient resources.   Ophelia Shoulder, PA-C 07/13/19 1229    07/15/19, PA-C 07/13/19 1231    07/15/19, MD 07/13/19 2037

## 2019-07-15 DIAGNOSIS — S62354A Nondisplaced fracture of shaft of fourth metacarpal bone, right hand, initial encounter for closed fracture: Secondary | ICD-10-CM | POA: Diagnosis not present

## 2019-07-15 DIAGNOSIS — S62356A Nondisplaced fracture of shaft of fifth metacarpal bone, right hand, initial encounter for closed fracture: Secondary | ICD-10-CM | POA: Diagnosis not present

## 2019-07-18 DIAGNOSIS — F4310 Post-traumatic stress disorder, unspecified: Secondary | ICD-10-CM | POA: Diagnosis not present

## 2019-07-25 DIAGNOSIS — F4310 Post-traumatic stress disorder, unspecified: Secondary | ICD-10-CM | POA: Diagnosis not present

## 2019-07-26 DIAGNOSIS — J301 Allergic rhinitis due to pollen: Secondary | ICD-10-CM | POA: Diagnosis not present

## 2019-07-26 DIAGNOSIS — R5383 Other fatigue: Secondary | ICD-10-CM | POA: Diagnosis not present

## 2019-07-26 DIAGNOSIS — G4733 Obstructive sleep apnea (adult) (pediatric): Secondary | ICD-10-CM | POA: Diagnosis not present

## 2019-07-26 DIAGNOSIS — J4530 Mild persistent asthma, uncomplicated: Secondary | ICD-10-CM | POA: Diagnosis not present

## 2019-07-29 DIAGNOSIS — F321 Major depressive disorder, single episode, moderate: Secondary | ICD-10-CM | POA: Diagnosis not present

## 2019-08-01 DIAGNOSIS — F4310 Post-traumatic stress disorder, unspecified: Secondary | ICD-10-CM | POA: Diagnosis not present

## 2019-08-07 DIAGNOSIS — S62354D Nondisplaced fracture of shaft of fourth metacarpal bone, right hand, subsequent encounter for fracture with routine healing: Secondary | ICD-10-CM | POA: Diagnosis not present

## 2019-08-07 DIAGNOSIS — S62356D Nondisplaced fracture of shaft of fifth metacarpal bone, right hand, subsequent encounter for fracture with routine healing: Secondary | ICD-10-CM | POA: Diagnosis not present

## 2019-08-08 DIAGNOSIS — F4310 Post-traumatic stress disorder, unspecified: Secondary | ICD-10-CM | POA: Diagnosis not present

## 2019-08-15 DIAGNOSIS — F4310 Post-traumatic stress disorder, unspecified: Secondary | ICD-10-CM | POA: Diagnosis not present

## 2019-08-22 DIAGNOSIS — F4310 Post-traumatic stress disorder, unspecified: Secondary | ICD-10-CM | POA: Diagnosis not present

## 2019-08-26 DIAGNOSIS — S62356D Nondisplaced fracture of shaft of fifth metacarpal bone, right hand, subsequent encounter for fracture with routine healing: Secondary | ICD-10-CM | POA: Diagnosis not present

## 2019-08-26 DIAGNOSIS — S62354D Nondisplaced fracture of shaft of fourth metacarpal bone, right hand, subsequent encounter for fracture with routine healing: Secondary | ICD-10-CM | POA: Diagnosis not present

## 2019-08-29 DIAGNOSIS — F4310 Post-traumatic stress disorder, unspecified: Secondary | ICD-10-CM | POA: Diagnosis not present

## 2019-09-01 DIAGNOSIS — I252 Old myocardial infarction: Secondary | ICD-10-CM | POA: Diagnosis not present

## 2019-09-01 DIAGNOSIS — M79641 Pain in right hand: Secondary | ICD-10-CM | POA: Diagnosis not present

## 2019-09-01 DIAGNOSIS — Z91018 Allergy to other foods: Secondary | ICD-10-CM | POA: Diagnosis not present

## 2019-09-01 DIAGNOSIS — Z79899 Other long term (current) drug therapy: Secondary | ICD-10-CM | POA: Diagnosis not present

## 2019-09-01 DIAGNOSIS — M19041 Primary osteoarthritis, right hand: Secondary | ICD-10-CM | POA: Diagnosis not present

## 2019-09-01 DIAGNOSIS — M7989 Other specified soft tissue disorders: Secondary | ICD-10-CM | POA: Diagnosis not present

## 2019-09-01 DIAGNOSIS — S60221A Contusion of right hand, initial encounter: Secondary | ICD-10-CM | POA: Diagnosis not present

## 2019-09-01 DIAGNOSIS — S62234A Other nondisplaced fracture of base of first metacarpal bone, right hand, initial encounter for closed fracture: Secondary | ICD-10-CM | POA: Diagnosis not present

## 2019-09-01 DIAGNOSIS — Z7982 Long term (current) use of aspirin: Secondary | ICD-10-CM | POA: Diagnosis not present

## 2019-09-01 DIAGNOSIS — F419 Anxiety disorder, unspecified: Secondary | ICD-10-CM | POA: Diagnosis not present

## 2019-09-01 DIAGNOSIS — S62502A Fracture of unspecified phalanx of left thumb, initial encounter for closed fracture: Secondary | ICD-10-CM | POA: Diagnosis not present

## 2019-09-01 DIAGNOSIS — Z91040 Latex allergy status: Secondary | ICD-10-CM | POA: Diagnosis not present

## 2019-09-01 DIAGNOSIS — S62231A Other displaced fracture of base of first metacarpal bone, right hand, initial encounter for closed fracture: Secondary | ICD-10-CM | POA: Diagnosis not present

## 2019-09-01 DIAGNOSIS — Z6830 Body mass index (BMI) 30.0-30.9, adult: Secondary | ICD-10-CM | POA: Diagnosis not present

## 2019-09-01 DIAGNOSIS — G40909 Epilepsy, unspecified, not intractable, without status epilepticus: Secondary | ICD-10-CM | POA: Diagnosis not present

## 2019-09-01 DIAGNOSIS — Z91041 Radiographic dye allergy status: Secondary | ICD-10-CM | POA: Diagnosis not present

## 2019-09-02 DIAGNOSIS — S60221A Contusion of right hand, initial encounter: Secondary | ICD-10-CM | POA: Diagnosis not present

## 2019-09-09 DIAGNOSIS — G4733 Obstructive sleep apnea (adult) (pediatric): Secondary | ICD-10-CM | POA: Diagnosis not present

## 2019-09-09 DIAGNOSIS — F321 Major depressive disorder, single episode, moderate: Secondary | ICD-10-CM | POA: Diagnosis not present

## 2019-09-20 DIAGNOSIS — J301 Allergic rhinitis due to pollen: Secondary | ICD-10-CM | POA: Diagnosis not present

## 2019-09-20 DIAGNOSIS — R5383 Other fatigue: Secondary | ICD-10-CM | POA: Diagnosis not present

## 2019-09-20 DIAGNOSIS — G4733 Obstructive sleep apnea (adult) (pediatric): Secondary | ICD-10-CM | POA: Diagnosis not present

## 2019-09-20 DIAGNOSIS — J4530 Mild persistent asthma, uncomplicated: Secondary | ICD-10-CM | POA: Diagnosis not present

## 2019-09-23 DIAGNOSIS — E039 Hypothyroidism, unspecified: Secondary | ICD-10-CM | POA: Diagnosis not present

## 2019-09-23 DIAGNOSIS — K219 Gastro-esophageal reflux disease without esophagitis: Secondary | ICD-10-CM | POA: Diagnosis not present

## 2019-09-23 DIAGNOSIS — K429 Umbilical hernia without obstruction or gangrene: Secondary | ICD-10-CM | POA: Diagnosis not present

## 2019-09-23 DIAGNOSIS — M1990 Unspecified osteoarthritis, unspecified site: Secondary | ICD-10-CM | POA: Diagnosis not present

## 2019-09-23 DIAGNOSIS — R1030 Lower abdominal pain, unspecified: Secondary | ICD-10-CM | POA: Diagnosis not present

## 2019-09-23 DIAGNOSIS — K5900 Constipation, unspecified: Secondary | ICD-10-CM | POA: Diagnosis not present

## 2019-09-23 DIAGNOSIS — Z79899 Other long term (current) drug therapy: Secondary | ICD-10-CM | POA: Diagnosis not present

## 2019-10-02 DIAGNOSIS — S62356D Nondisplaced fracture of shaft of fifth metacarpal bone, right hand, subsequent encounter for fracture with routine healing: Secondary | ICD-10-CM | POA: Diagnosis not present

## 2019-10-02 DIAGNOSIS — S60221A Contusion of right hand, initial encounter: Secondary | ICD-10-CM | POA: Diagnosis not present

## 2019-10-02 DIAGNOSIS — S63601A Unspecified sprain of right thumb, initial encounter: Secondary | ICD-10-CM | POA: Diagnosis not present

## 2019-10-08 DIAGNOSIS — Z6831 Body mass index (BMI) 31.0-31.9, adult: Secondary | ICD-10-CM | POA: Diagnosis not present

## 2019-10-08 DIAGNOSIS — H5711 Ocular pain, right eye: Secondary | ICD-10-CM | POA: Diagnosis not present

## 2019-10-08 DIAGNOSIS — F329 Major depressive disorder, single episode, unspecified: Secondary | ICD-10-CM | POA: Diagnosis not present

## 2019-10-08 DIAGNOSIS — Z91018 Allergy to other foods: Secondary | ICD-10-CM | POA: Diagnosis not present

## 2019-10-08 DIAGNOSIS — R519 Headache, unspecified: Secondary | ICD-10-CM | POA: Diagnosis not present

## 2019-10-08 DIAGNOSIS — H538 Other visual disturbances: Secondary | ICD-10-CM | POA: Diagnosis not present

## 2019-10-08 DIAGNOSIS — Z7982 Long term (current) use of aspirin: Secondary | ICD-10-CM | POA: Diagnosis not present

## 2019-10-08 DIAGNOSIS — Z79899 Other long term (current) drug therapy: Secondary | ICD-10-CM | POA: Diagnosis not present

## 2019-10-08 DIAGNOSIS — T2020XA Burn of second degree of head, face, and neck, unspecified site, initial encounter: Secondary | ICD-10-CM | POA: Diagnosis not present

## 2019-10-08 DIAGNOSIS — I252 Old myocardial infarction: Secondary | ICD-10-CM | POA: Diagnosis not present

## 2019-10-08 DIAGNOSIS — F419 Anxiety disorder, unspecified: Secondary | ICD-10-CM | POA: Diagnosis not present

## 2019-10-09 DIAGNOSIS — T310 Burns involving less than 10% of body surface: Secondary | ICD-10-CM | POA: Diagnosis not present

## 2019-10-09 DIAGNOSIS — I252 Old myocardial infarction: Secondary | ICD-10-CM | POA: Diagnosis not present

## 2019-10-09 DIAGNOSIS — F329 Major depressive disorder, single episode, unspecified: Secondary | ICD-10-CM | POA: Diagnosis not present

## 2019-10-09 DIAGNOSIS — L03211 Cellulitis of face: Secondary | ICD-10-CM | POA: Diagnosis not present

## 2019-10-09 DIAGNOSIS — Z91018 Allergy to other foods: Secondary | ICD-10-CM | POA: Diagnosis not present

## 2019-10-09 DIAGNOSIS — F419 Anxiety disorder, unspecified: Secondary | ICD-10-CM | POA: Diagnosis not present

## 2019-10-09 DIAGNOSIS — T2641XA Burn of right eye and adnexa, part unspecified, initial encounter: Secondary | ICD-10-CM | POA: Diagnosis not present

## 2019-10-09 DIAGNOSIS — Z7982 Long term (current) use of aspirin: Secondary | ICD-10-CM | POA: Diagnosis not present

## 2019-10-09 DIAGNOSIS — Z6834 Body mass index (BMI) 34.0-34.9, adult: Secondary | ICD-10-CM | POA: Diagnosis not present

## 2019-10-09 DIAGNOSIS — R600 Localized edema: Secondary | ICD-10-CM | POA: Diagnosis not present

## 2019-10-09 DIAGNOSIS — T2026XA Burn of second degree of forehead and cheek, initial encounter: Secondary | ICD-10-CM | POA: Diagnosis not present

## 2019-10-09 DIAGNOSIS — T2020XA Burn of second degree of head, face, and neck, unspecified site, initial encounter: Secondary | ICD-10-CM | POA: Diagnosis not present

## 2019-10-09 DIAGNOSIS — F4310 Post-traumatic stress disorder, unspecified: Secondary | ICD-10-CM | POA: Diagnosis not present

## 2019-10-21 DIAGNOSIS — F321 Major depressive disorder, single episode, moderate: Secondary | ICD-10-CM | POA: Diagnosis not present

## 2019-10-30 DIAGNOSIS — G4733 Obstructive sleep apnea (adult) (pediatric): Secondary | ICD-10-CM | POA: Diagnosis not present

## 2019-10-30 DIAGNOSIS — J301 Allergic rhinitis due to pollen: Secondary | ICD-10-CM | POA: Diagnosis not present

## 2019-10-30 DIAGNOSIS — R5383 Other fatigue: Secondary | ICD-10-CM | POA: Diagnosis not present

## 2019-10-30 DIAGNOSIS — J4530 Mild persistent asthma, uncomplicated: Secondary | ICD-10-CM | POA: Diagnosis not present

## 2019-11-08 DIAGNOSIS — R569 Unspecified convulsions: Secondary | ICD-10-CM | POA: Diagnosis not present

## 2019-11-08 DIAGNOSIS — Z13220 Encounter for screening for lipoid disorders: Secondary | ICD-10-CM | POA: Diagnosis not present

## 2019-11-08 DIAGNOSIS — F4310 Post-traumatic stress disorder, unspecified: Secondary | ICD-10-CM | POA: Diagnosis not present

## 2019-11-08 DIAGNOSIS — J4530 Mild persistent asthma, uncomplicated: Secondary | ICD-10-CM | POA: Diagnosis not present

## 2019-11-08 DIAGNOSIS — Z13228 Encounter for screening for other metabolic disorders: Secondary | ICD-10-CM | POA: Diagnosis not present

## 2019-11-08 DIAGNOSIS — Z131 Encounter for screening for diabetes mellitus: Secondary | ICD-10-CM | POA: Diagnosis not present

## 2019-11-08 DIAGNOSIS — Z23 Encounter for immunization: Secondary | ICD-10-CM | POA: Diagnosis not present

## 2019-11-08 DIAGNOSIS — Z1339 Encounter for screening examination for other mental health and behavioral disorders: Secondary | ICD-10-CM | POA: Diagnosis not present

## 2019-11-08 DIAGNOSIS — Z0000 Encounter for general adult medical examination without abnormal findings: Secondary | ICD-10-CM | POA: Diagnosis not present

## 2019-11-08 DIAGNOSIS — Z139 Encounter for screening, unspecified: Secondary | ICD-10-CM | POA: Diagnosis not present

## 2019-11-08 DIAGNOSIS — Z1331 Encounter for screening for depression: Secondary | ICD-10-CM | POA: Diagnosis not present

## 2019-11-08 DIAGNOSIS — G4733 Obstructive sleep apnea (adult) (pediatric): Secondary | ICD-10-CM | POA: Diagnosis not present

## 2019-11-28 DIAGNOSIS — J069 Acute upper respiratory infection, unspecified: Secondary | ICD-10-CM | POA: Diagnosis not present

## 2019-11-28 DIAGNOSIS — Z20822 Contact with and (suspected) exposure to covid-19: Secondary | ICD-10-CM | POA: Diagnosis not present

## 2019-12-02 DIAGNOSIS — S60221A Contusion of right hand, initial encounter: Secondary | ICD-10-CM | POA: Diagnosis not present

## 2019-12-02 DIAGNOSIS — J209 Acute bronchitis, unspecified: Secondary | ICD-10-CM | POA: Diagnosis not present

## 2019-12-02 DIAGNOSIS — S63601A Unspecified sprain of right thumb, initial encounter: Secondary | ICD-10-CM | POA: Diagnosis not present

## 2019-12-02 DIAGNOSIS — S62356D Nondisplaced fracture of shaft of fifth metacarpal bone, right hand, subsequent encounter for fracture with routine healing: Secondary | ICD-10-CM | POA: Diagnosis not present

## 2019-12-02 DIAGNOSIS — R0602 Shortness of breath: Secondary | ICD-10-CM | POA: Diagnosis not present

## 2019-12-02 DIAGNOSIS — S62354D Nondisplaced fracture of shaft of fourth metacarpal bone, right hand, subsequent encounter for fracture with routine healing: Secondary | ICD-10-CM | POA: Diagnosis not present

## 2019-12-02 DIAGNOSIS — J440 Chronic obstructive pulmonary disease with acute lower respiratory infection: Secondary | ICD-10-CM | POA: Diagnosis not present

## 2019-12-02 DIAGNOSIS — R059 Cough, unspecified: Secondary | ICD-10-CM | POA: Diagnosis not present

## 2019-12-16 DIAGNOSIS — F321 Major depressive disorder, single episode, moderate: Secondary | ICD-10-CM | POA: Diagnosis not present

## 2019-12-26 DIAGNOSIS — H6690 Otitis media, unspecified, unspecified ear: Secondary | ICD-10-CM | POA: Diagnosis not present

## 2019-12-26 DIAGNOSIS — J069 Acute upper respiratory infection, unspecified: Secondary | ICD-10-CM | POA: Diagnosis not present

## 2020-01-14 DIAGNOSIS — G4733 Obstructive sleep apnea (adult) (pediatric): Secondary | ICD-10-CM | POA: Diagnosis not present

## 2020-01-15 DIAGNOSIS — Z7689 Persons encountering health services in other specified circumstances: Secondary | ICD-10-CM | POA: Diagnosis not present

## 2020-01-15 DIAGNOSIS — H9201 Otalgia, right ear: Secondary | ICD-10-CM | POA: Diagnosis not present

## 2020-01-15 DIAGNOSIS — Z8669 Personal history of other diseases of the nervous system and sense organs: Secondary | ICD-10-CM | POA: Diagnosis not present

## 2020-01-23 DIAGNOSIS — Z79899 Other long term (current) drug therapy: Secondary | ICD-10-CM | POA: Diagnosis not present

## 2020-01-23 DIAGNOSIS — H60311 Diffuse otitis externa, right ear: Secondary | ICD-10-CM | POA: Diagnosis not present

## 2020-01-23 DIAGNOSIS — Z13220 Encounter for screening for lipoid disorders: Secondary | ICD-10-CM | POA: Diagnosis not present

## 2020-01-23 DIAGNOSIS — F321 Major depressive disorder, single episode, moderate: Secondary | ICD-10-CM | POA: Diagnosis not present

## 2020-01-23 DIAGNOSIS — R5383 Other fatigue: Secondary | ICD-10-CM | POA: Diagnosis not present

## 2020-01-23 DIAGNOSIS — H9211 Otorrhea, right ear: Secondary | ICD-10-CM | POA: Diagnosis not present

## 2020-01-23 DIAGNOSIS — Z23 Encounter for immunization: Secondary | ICD-10-CM | POA: Diagnosis not present

## 2020-01-29 DIAGNOSIS — H60501 Unspecified acute noninfective otitis externa, right ear: Secondary | ICD-10-CM | POA: Diagnosis not present

## 2020-02-06 DIAGNOSIS — M542 Cervicalgia: Secondary | ICD-10-CM | POA: Diagnosis not present

## 2020-02-06 DIAGNOSIS — H9201 Otalgia, right ear: Secondary | ICD-10-CM | POA: Diagnosis not present

## 2020-02-06 DIAGNOSIS — H6981 Other specified disorders of Eustachian tube, right ear: Secondary | ICD-10-CM | POA: Diagnosis not present

## 2020-02-06 DIAGNOSIS — R490 Dysphonia: Secondary | ICD-10-CM | POA: Diagnosis not present

## 2020-02-06 DIAGNOSIS — H9193 Unspecified hearing loss, bilateral: Secondary | ICD-10-CM | POA: Diagnosis not present

## 2020-02-06 DIAGNOSIS — R0981 Nasal congestion: Secondary | ICD-10-CM | POA: Diagnosis not present

## 2020-02-18 DIAGNOSIS — G4733 Obstructive sleep apnea (adult) (pediatric): Secondary | ICD-10-CM | POA: Diagnosis not present

## 2020-02-24 DIAGNOSIS — F321 Major depressive disorder, single episode, moderate: Secondary | ICD-10-CM | POA: Diagnosis not present

## 2020-02-25 DIAGNOSIS — H9201 Otalgia, right ear: Secondary | ICD-10-CM | POA: Diagnosis not present

## 2020-02-25 DIAGNOSIS — H6091 Unspecified otitis externa, right ear: Secondary | ICD-10-CM | POA: Diagnosis not present

## 2020-02-25 DIAGNOSIS — H9319 Tinnitus, unspecified ear: Secondary | ICD-10-CM | POA: Diagnosis not present

## 2020-02-25 DIAGNOSIS — H9190 Unspecified hearing loss, unspecified ear: Secondary | ICD-10-CM | POA: Diagnosis not present

## 2020-02-28 DIAGNOSIS — G4733 Obstructive sleep apnea (adult) (pediatric): Secondary | ICD-10-CM | POA: Diagnosis not present

## 2020-02-28 DIAGNOSIS — Z23 Encounter for immunization: Secondary | ICD-10-CM | POA: Diagnosis not present

## 2020-02-28 DIAGNOSIS — J4530 Mild persistent asthma, uncomplicated: Secondary | ICD-10-CM | POA: Diagnosis not present

## 2020-02-28 DIAGNOSIS — R5383 Other fatigue: Secondary | ICD-10-CM | POA: Diagnosis not present

## 2020-02-28 DIAGNOSIS — J301 Allergic rhinitis due to pollen: Secondary | ICD-10-CM | POA: Diagnosis not present

## 2020-03-05 DIAGNOSIS — H9201 Otalgia, right ear: Secondary | ICD-10-CM | POA: Diagnosis not present

## 2020-03-05 DIAGNOSIS — H6091 Unspecified otitis externa, right ear: Secondary | ICD-10-CM | POA: Diagnosis not present

## 2020-03-20 DIAGNOSIS — K219 Gastro-esophageal reflux disease without esophagitis: Secondary | ICD-10-CM | POA: Diagnosis not present

## 2020-03-20 DIAGNOSIS — E039 Hypothyroidism, unspecified: Secondary | ICD-10-CM | POA: Diagnosis not present

## 2020-03-20 DIAGNOSIS — I252 Old myocardial infarction: Secondary | ICD-10-CM | POA: Diagnosis not present

## 2020-03-20 DIAGNOSIS — Z79899 Other long term (current) drug therapy: Secondary | ICD-10-CM | POA: Diagnosis not present

## 2020-03-20 DIAGNOSIS — R0602 Shortness of breath: Secondary | ICD-10-CM | POA: Diagnosis not present

## 2020-03-20 DIAGNOSIS — R0789 Other chest pain: Secondary | ICD-10-CM | POA: Diagnosis not present

## 2020-03-20 DIAGNOSIS — R079 Chest pain, unspecified: Secondary | ICD-10-CM | POA: Diagnosis not present

## 2020-03-30 DIAGNOSIS — Z23 Encounter for immunization: Secondary | ICD-10-CM | POA: Diagnosis not present
